# Patient Record
Sex: Male | Born: 1965 | Race: Black or African American | Hispanic: No | Smoking: Current every day smoker
Health system: Southern US, Community
[De-identification: ages and names within clinical notes are randomized; demographics above are authoritative.]

## PROBLEM LIST (undated history)

## (undated) DIAGNOSIS — E785 Hyperlipidemia, unspecified: Secondary | ICD-10-CM

## (undated) DIAGNOSIS — M109 Gout, unspecified: Secondary | ICD-10-CM

## (undated) DIAGNOSIS — I10 Essential (primary) hypertension: Secondary | ICD-10-CM

## (undated) HISTORY — PX: OTHER SURGICAL HISTORY: SHX169

---

## 2014-12-31 ENCOUNTER — Encounter (HOSPITAL_COMMUNITY): Payer: Self-pay | Admitting: Emergency Medicine

## 2014-12-31 ENCOUNTER — Emergency Department (HOSPITAL_COMMUNITY)
Admission: EM | Admit: 2014-12-31 | Discharge: 2014-12-31 | Disposition: A | Discharge status: Home / Self Care | Payer: Medicaid - Out of State | Attending: Emergency Medicine | Admitting: Emergency Medicine

## 2014-12-31 DIAGNOSIS — K088 Other specified disorders of teeth and supporting structures: Secondary | ICD-10-CM | POA: Diagnosis present

## 2014-12-31 DIAGNOSIS — I10 Essential (primary) hypertension: Secondary | ICD-10-CM | POA: Insufficient documentation

## 2014-12-31 DIAGNOSIS — K029 Dental caries, unspecified: Secondary | ICD-10-CM

## 2014-12-31 DIAGNOSIS — Z720 Tobacco use: Secondary | ICD-10-CM | POA: Insufficient documentation

## 2014-12-31 DIAGNOSIS — K0889 Other specified disorders of teeth and supporting structures: Secondary | ICD-10-CM

## 2014-12-31 HISTORY — DX: Essential (primary) hypertension: I10

## 2014-12-31 MED ORDER — AMOXICILLIN 500 MG PO CAPS: 500.0000 mg | ORAL_CAPSULE | Freq: Three times a day (TID) | ORAL | Status: DC

## 2014-12-31 MED ORDER — OXYCODONE-ACETAMINOPHEN 5-325 MG PO TABS: 2.0000 | ORAL_TABLET | ORAL | Status: DC | PRN

## 2014-12-31 MED ORDER — LISINOPRIL 10 MG PO TABS: 10.0000 mg | ORAL_TABLET | Freq: Every day | ORAL | Status: DC

## 2014-12-31 MED ORDER — OXYCODONE-ACETAMINOPHEN 5-325 MG PO TABS
2.0000 | ORAL_TABLET | Freq: Once | ORAL | Status: AC
  Administered 2014-12-31: 2 via ORAL
  Filled 2014-12-31: qty 2

## 2014-12-31 NOTE — Discharge Instructions (Signed)
Dental Caries  Dental caries (also called tooth decay) is the most common oral disease. It can occur at any age but is more common in children and young adults.   HOW DENTAL CARIES DEVELOPS   The process of decay begins when bacteria and foods (particularly sugars and starches) combine in your mouth to produce plaque. Plaque is a substance that sticks to the hard, outer surface of a tooth (enamel). The bacteria in plaque produce acids that attack enamel. These acids may also attack the root surface of a tooth (cementum) if it is exposed. Repeated attacks dissolve these surfaces and create holes in the tooth (cavities). If left untreated, the acids destroy the other layers of the tooth.   RISK FACTORS   Frequent sipping of sugary beverages.    Frequent snacking on sugary and starchy foods, especially those that easily get stuck in the teeth.    Poor oral hygiene.    Dry mouth.    Substance abuse such as methamphetamine abuse.    Broken or poor-fitting dental restorations.    Eating disorders.    Gastroesophageal reflux disease (GERD).    Certain radiation treatments to the head and neck.  SYMPTOMS  In the early stages of dental caries, symptoms are seldom present. Sometimes white, chalky areas may be seen on the enamel or other tooth layers. In later stages, symptoms may include:   Pits and holes on the enamel.   Toothache after sweet, hot, or cold foods or drinks are consumed.   Pain around the tooth.   Swelling around the tooth.  DIAGNOSIS   Most of the time, dental caries is detected during a regular dental checkup. A diagnosis is made after a thorough medical and dental history is taken and the surfaces of your teeth are checked for signs of dental caries. Sometimes special instruments, such as lasers, are used to check for dental caries. Dental X-ray exams may be taken so that areas not visible to the eye (such as between the contact areas of the teeth) can be checked for cavities.    TREATMENT   If dental caries is in its early stages, it may be reversed with a fluoride treatment or an application of a remineralizing agent at the dental office. Thorough brushing and flossing at home is needed to aid these treatments. If it is in its later stages, treatment depends on the location and extent of tooth destruction:    If a small area of the tooth has been destroyed, the destroyed area will be removed and cavities will be filled with a material such as gold, silver amalgam, or composite resin.    If a large area of the tooth has been destroyed, the destroyed area will be removed and a cap (crown) will be fitted over the remaining tooth structure.    If the center part of the tooth (pulp) is affected, a procedure called a root canal will be needed before a filling or crown can be placed.    If most of the tooth has been destroyed, the tooth may need to be pulled (extracted).  HOME CARE INSTRUCTIONS  You can prevent, stop, or reverse dental caries at home by practicing good oral hygiene. Good oral hygiene includes:   Thoroughly cleaning your teeth at least twice a day with a toothbrush and dental floss.    Using a fluoride toothpaste. A fluoride mouth rinse may also be used if recommended by your dentist or health care provider.    Restricting   the amount of sugary and starchy foods and sugary liquids you consume.    Avoiding frequent snacking on these foods and sipping of these liquids.    Keeping regular visits with a dentist for checkups and cleanings.  PREVENTION    Practice good oral hygiene.   Consider a dental sealant. A dental sealant is a coating material that is applied by your dentist to the pits and grooves of teeth. The sealant prevents food from being trapped in them. It may protect the teeth for several years.   Ask about fluoride supplements if you live in a community without fluorinated water or with water that has a low fluoride content. Use fluoride supplements  as directed by your dentist or health care provider.   Allow fluoride varnish applications to teeth if directed by your dentist or health care provider.  Document Released: 03/27/2002 Document Revised: 11/19/2013 Document Reviewed: 07/07/2012  [employer] Patient Information 2015 [employer]. This information is not intended to replace advice given to you by your health care provider. Make sure you discuss any questions you have with your health care provider.

## 2014-12-31 NOTE — ED Notes (Signed)
Pt states that his teeth are very sensitive to any food or drink. Pt states he has not been taking HTN meds due to that fact.

## 2014-12-31 NOTE — ED Provider Notes (Signed)
CSN: [id number]     Arrival date & time 12/31/14  1234 History  This chart was scribed for non-physician practitioner, Catha Gosselin, PA-C working with Azalia Bilis, MD, by Abel Presto, ED Scribe. This patient was seen in room WTR6/WTR6 and the patient's care was started at 1:05 PM.     Chief Complaint  Patient presents with  . Dental Pain    The history is provided by the patient. No language interpreter was used.   HPI Comments: Edwin Reyes is a 49 y.o. male who presents to the Emergency Department complaining of constant worsening generalized dental pain, mostly in the front and with onset 4 days ago. Pt reports similar pain in the past and h/o dental caries.  Pt states he has been unable to drink warm or cool drinks. He has been unable to take his lisinopril as he is unable to tolerate PO intake.   He reports eating and talking makes the pain worse.  Pt has tried taking ibuprofen for relief.  He denies drooling, fever, and facial swelling.   Past Medical History  Diagnosis Date  . Hypertension    History reviewed. No pertinent past surgical history. No family history on file. History  Substance Use Topics  . Smoking status: Current Every Day Smoker  . Smokeless tobacco: Not on file  . Alcohol Use: No    Review of Systems  Constitutional: Negative for fever.  HENT: Positive for dental problem. Negative for drooling and facial swelling.       Allergies  Review of patient's allergies indicates no known allergies.  Home Medications   Prior to Admission medications   Medication Sig Start Date End Date Taking? Authorizing Provider  ibuprofen (ADVIL,MOTRIN) 200 MG tablet Take 800 mg by mouth every 6 (six) hours as needed for headache or moderate pain.   Yes Historical Provider, MD  amoxicillin (AMOXIL) 500 MG capsule Take 1 capsule (500 mg total) by mouth 3 (three) times daily. 12/31/14   Rakeya Glab Patel-Mills, PA-C  lisinopril (ZESTRIL) 10 MG tablet Take 1 tablet (10 mg  total) by mouth daily. 12/31/14   Paolina Karwowski Patel-Mills, PA-C  oxyCODONE-acetaminophen (PERCOCET/ROXICET) 5-325 MG per tablet Take 2 tablets by mouth every 4 (four) hours as needed for severe pain. 12/31/14   Darryn Kydd Patel-Mills, PA-C   BP 152/94 mmHg  Pulse 66  Temp(Src) 98.1 F (36.7 C) (Oral)  Resp 18  SpO2 99% Physical Exam  Constitutional: He is oriented to person, place, and time. He appears well-developed and well-nourished.  HENT:  Head: Normocephalic.  Mouth/Throat: Uvula is midline, oropharynx is clear and moist and mucous membranes are normal. No trismus in the jaw. Abnormal dentition. Dental caries present. No dental abscesses or uvula swelling. No oropharyngeal exudate or tonsillar abscesses.     No drooling. No palpable facial abscess or dental abscess. No facial swelling. No trismus. No submandibular tenderness.  Eyes: Conjunctivae are normal.  Neck: Normal range of motion. Neck supple.  Pulmonary/Chest: Effort normal.  Musculoskeletal: Normal range of motion.  Neurological: He is alert and oriented to person, place, and time.  Skin: Skin is warm and dry.  Psychiatric: He has a normal mood and affect. His behavior is normal.  Nursing note and vitals reviewed.   ED Course  Procedures (including critical care time) DIAGNOSTIC STUDIES: Oxygen Saturation is 98% on room air, normal by my interpretation.    COORDINATION OF CARE: 1:12 PM Discussed treatment plan with patient at beside, the patient agrees with the plan and has  no further questions at this time.   Labs Review Labs Reviewed - No data to display  Imaging Review No results found.   EKG Interpretation None      MDM   Final diagnoses:  Pain, dental  Dental caries  Patient presents for dental pain. He has no signs of Ludwigs angina.  He is afebrile, no submandibular edema or tenderness, no facial swelling, no drooling, no trismus. I have prescribed the patient amoxicillin, Percocet and lisinopril. Patient  states he is from [place] and does not have any more of his lisinopril prescription remaining and is asking for a refill. His blood pressure is 152/94 and he is afebrile. Medications  oxyCODONE-acetaminophen (PERCOCET/ROXICET) 5-325 MG per tablet 2 tablet (2 tablets Oral Given 12/31/14 1323)   I personally performed the services described in this documentation, which was scribed in my presence. The recorded information has been reviewed and is accurate.     Catha Gosselin, PA-C 12/31/14 1651  Azalia Bilis, MD 01/03/15 [phone]

## 2015-09-02 ENCOUNTER — Encounter: Payer: Self-pay | Admitting: Family Medicine

## 2015-09-02 ENCOUNTER — Ambulatory Visit (INDEPENDENT_AMBULATORY_CARE_PROVIDER_SITE_OTHER): Payer: Medicaid - Out of State | Admitting: Family Medicine

## 2015-09-02 ENCOUNTER — Other Ambulatory Visit: Payer: Self-pay | Admitting: Family Medicine

## 2015-09-02 VITALS — BP 150/82 | HR 75 | Temp 98.2°F | Resp 18 | Ht 70.0 in | Wt 259.0 lb

## 2015-09-02 DIAGNOSIS — E669 Obesity, unspecified: Secondary | ICD-10-CM | POA: Diagnosis not present

## 2015-09-02 DIAGNOSIS — K0889 Other specified disorders of teeth and supporting structures: Secondary | ICD-10-CM | POA: Insufficient documentation

## 2015-09-02 DIAGNOSIS — I10 Essential (primary) hypertension: Secondary | ICD-10-CM | POA: Insufficient documentation

## 2015-09-02 DIAGNOSIS — K0510 Chronic gingivitis, plaque induced: Secondary | ICD-10-CM | POA: Diagnosis not present

## 2015-09-02 LAB — CBC WITH DIFFERENTIAL/PLATELET
BASOS ABS: 0.1 10*3/uL (ref 0.0–0.1)
Basophils Relative: 1 % (ref 0–1)
EOS ABS: 0.1 10*3/uL (ref 0.0–0.7)
Eosinophils Relative: 2 % (ref 0–5)
HCT: 42.8 % (ref 39.0–52.0)
Hemoglobin: 14.4 g/dL (ref 13.0–17.0)
LYMPHS ABS: 2 10*3/uL (ref 0.7–4.0)
Lymphocytes Relative: 30 % (ref 12–46)
MCH: 29.6 pg (ref 26.0–34.0)
MCHC: 33.6 g/dL (ref 30.0–36.0)
MCV: 88.1 fL (ref 78.0–100.0)
MPV: 9.7 fL (ref 8.6–12.4)
Monocytes Absolute: 0.8 10*3/uL (ref 0.1–1.0)
Monocytes Relative: 12 % (ref 3–12)
NEUTROS ABS: 3.6 10*3/uL (ref 1.7–7.7)
NEUTROS PCT: 55 % (ref 43–77)
PLATELETS: 310 10*3/uL (ref 150–400)
RBC: 4.86 MIL/uL (ref 4.22–5.81)
RDW: 13.7 % (ref 11.5–15.5)
WBC: 6.6 10*3/uL (ref 4.0–10.5)

## 2015-09-02 LAB — POCT URINALYSIS DIP (DEVICE)
BILIRUBIN URINE: NEGATIVE
Glucose, UA: NEGATIVE mg/dL
Hgb urine dipstick: NEGATIVE
KETONES UR: NEGATIVE mg/dL
LEUKOCYTES UA: NEGATIVE
NITRITE: NEGATIVE
Protein, ur: NEGATIVE mg/dL
Specific Gravity, Urine: 1.025 (ref 1.005–1.030)
Urobilinogen, UA: 0.2 mg/dL (ref 0.0–1.0)
pH: 6.5 (ref 5.0–8.0)

## 2015-09-02 LAB — COMPLETE METABOLIC PANEL WITH GFR
ALT: 60 U/L — AB (ref 9–46)
AST: 62 U/L — ABNORMAL HIGH (ref 10–40)
Albumin: 4.2 g/dL (ref 3.6–5.1)
Alkaline Phosphatase: 65 U/L (ref 40–115)
BUN: 14 mg/dL (ref 7–25)
CALCIUM: 9.3 mg/dL (ref 8.6–10.3)
CHLORIDE: 102 mmol/L (ref 98–110)
CO2: 29 mmol/L (ref 20–31)
Creat: 1.32 mg/dL (ref 0.60–1.35)
GFR, EST NON AFRICAN AMERICAN: 64 mL/min (ref >=60)
GFR, Est African American: 74 mL/min (ref >=60)
Glucose, Bld: 75 mg/dL (ref 65–99)
POTASSIUM: 4 mmol/L (ref 3.5–5.3)
Sodium: 137 mmol/L (ref 135–146)
Total Bilirubin: 0.5 mg/dL (ref 0.2–1.2)
Total Protein: 7.1 g/dL (ref 6.1–8.1)

## 2015-09-02 LAB — HEMOGLOBIN A1C
Hgb A1c MFr Bld: 5.7 % — ABNORMAL HIGH (ref <5.7)
Mean Plasma Glucose: 117 mg/dL — ABNORMAL HIGH (ref <117)

## 2015-09-02 MED ORDER — KETOROLAC TROMETHAMINE 60 MG/2ML IM SOLN
30.0000 mg | Freq: Once | INTRAMUSCULAR | Status: AC
  Administered 2015-09-02: 30 mg via INTRAMUSCULAR

## 2015-09-02 MED ORDER — AMOXICILLIN 500 MG PO CAPS: 500.0000 mg | ORAL_CAPSULE | Freq: Two times a day (BID) | ORAL | Status: DC

## 2015-09-02 MED ORDER — AMLODIPINE BESYLATE 5 MG PO TABS: 5.0000 mg | ORAL_TABLET | Freq: Every day | ORAL | Status: DC

## 2015-09-02 NOTE — Patient Instructions (Addendum)
Sent a referral to dental clinic .   Will start a trial of Amlodipine 5 mg for hypertension and follow up in 1 month.   DASH Eating Plan DASH stands for "Dietary Approaches to Stop Hypertension." The DASH eating plan is a healthy eating plan that has been shown to reduce high blood pressure (hypertension). Additional health benefits may include reducing the risk of type 2 diabetes mellitus, heart disease, and stroke. The DASH eating plan may also help with weight loss. WHAT DO I NEED TO KNOW ABOUT THE DASH EATING PLAN? For the DASH eating plan, you will follow these general guidelines:  Choose foods with a percent daily value for sodium of less than 5% (as listed on the food label).  Use salt-free seasonings or herbs instead of table salt or sea salt.  Check with your health care provider or pharmacist before using salt substitutes.  Eat lower-sodium products, often labeled as "lower sodium" or "no salt added."  Eat fresh foods.  Eat more vegetables, fruits, and low-fat dairy products.  Choose whole grains. Look for the word "whole" as the first word in the ingredient list.  Choose fish and skinless chicken or [place] more often than red meat. Limit fish, poultry, and meat to 6 oz (170 g) each day.  Limit sweets, desserts, sugars, and sugary drinks.  Choose heart-healthy fats.  Limit cheese to 1 oz (28 g) per day.  Eat more home-cooked food and less restaurant, buffet, and fast food.  Limit fried foods.  Cook foods using methods other than frying.  Limit canned vegetables. If you do use them, rinse them well to decrease the sodium.  When eating at a restaurant, ask that your food be prepared with less salt, or no salt if possible. WHAT FOODS CAN I EAT? Seek help from a dietitian for individual calorie needs. Grains Whole grain or whole wheat bread. Brown rice. Whole grain or whole wheat pasta. Quinoa, bulgur, and whole grain cereals. Low-sodium cereals. Corn or whole wheat  flour tortillas. Whole grain cornbread. Whole grain crackers. Low-sodium crackers. Vegetables Fresh or frozen vegetables (raw, steamed, roasted, or grilled). Low-sodium or reduced-sodium tomato and vegetable juices. Low-sodium or reduced-sodium tomato sauce and paste. Low-sodium or reduced-sodium canned vegetables.  Fruits All fresh, canned (in natural juice), or frozen fruits. Meat and Other Protein Products Ground beef (85% or leaner), grass-fed beef, or beef trimmed of fat. Skinless chicken or [place]. Ground chicken or [place]. Pork trimmed of fat. All fish and seafood. Eggs. Dried beans, peas, or lentils. Unsalted nuts and seeds. Unsalted canned beans. Dairy Low-fat dairy products, such as skim or 1% milk, 2% or reduced-fat cheeses, low-fat ricotta or cottage cheese, or plain low-fat yogurt. Low-sodium or reduced-sodium cheeses. Fats and Oils Tub margarines without trans fats. Light or reduced-fat mayonnaise and salad dressings (reduced sodium). Avocado. Safflower, olive, or canola oils. Natural peanut or almond butter. Other Unsalted popcorn and pretzels. The items listed above may not be a complete list of recommended foods or beverages. Contact your dietitian for more options. WHAT FOODS ARE NOT RECOMMENDED? Grains White bread. White pasta. White rice. Refined cornbread. Bagels and croissants. Crackers that contain trans fat. Vegetables Creamed or fried vegetables. Vegetables in a cheese sauce. Regular canned vegetables. Regular canned tomato sauce and paste. Regular tomato and vegetable juices. Fruits Dried fruits. Canned fruit in light or heavy syrup. Fruit juice. Meat and Other Protein Products Fatty cuts of meat. Ribs, chicken wings, bacon, sausage, bologna, salami, chitterlings, fatback, hot dogs, bratwurst,  and packaged luncheon meats. Salted nuts and seeds. Canned beans with salt. Dairy Whole or 2% milk, cream, half-and-half, and cream cheese. Whole-fat or sweetened yogurt.  Full-fat cheeses or blue cheese. Nondairy creamers and whipped toppings. Processed cheese, cheese spreads, or cheese curds. Condiments Onion and garlic salt, seasoned salt, table salt, and sea salt. Canned and packaged gravies. Worcestershire sauce. Tartar sauce. Barbecue sauce. Teriyaki sauce. Soy sauce, including reduced sodium. Steak sauce. Fish sauce. Oyster sauce. Cocktail sauce. Horseradish. Ketchup and mustard. Meat flavorings and tenderizers. Bouillon cubes. Hot sauce. Tabasco sauce. Marinades. Taco seasonings. Relishes. Fats and Oils Butter, stick margarine, lard, shortening, ghee, and bacon fat. Coconut, palm kernel, or palm oils. Regular salad dressings. Other Pickles and olives. Salted popcorn and pretzels. The items listed above may not be a complete list of foods and beverages to avoid. Contact your dietitian for more information. WHERE CAN I FIND MORE INFORMATION? National Heart, Lung, and Blood Institute: [employer]   This information is not intended to replace advice given to you by your health care provider. Make sure you discuss any questions you have with your health care provider.   Document Released: 06/24/2011 Document Revised: 07/26/2014 Document Reviewed: 05/09/2013 Elsevier Interactive Patient Education 2016 [employer]. Hypertension Hypertension, commonly called high blood pressure, is when the force of blood pumping through your arteries is too strong. Your arteries are the blood vessels that carry blood from your heart throughout your body. A blood pressure reading consists of a higher number over a lower number, such as 110/72. The higher number (systolic) is the pressure inside your arteries when your heart pumps. The lower number (diastolic) is the pressure inside your arteries when your heart relaxes. Ideally you want your blood pressure below 120/80. Hypertension forces your heart to work harder to pump blood. Your arteries  may become narrow or stiff. Having untreated or uncontrolled hypertension can cause heart attack, stroke, kidney disease, and other problems. RISK FACTORS Some risk factors for high blood pressure are controllable. Others are not.  Risk factors you cannot control include:   Race. You may be at higher risk if you are African American.  Age. Risk increases with age.  Gender. Men are at higher risk than women before age 27 years. After age 42, women are at higher risk than men. Risk factors you can control include:  Not getting enough exercise or physical activity.  Being overweight.  Getting too much fat, sugar, calories, or salt in your diet.  Drinking too much alcohol. SIGNS AND SYMPTOMS Hypertension does not usually cause signs or symptoms. Extremely high blood pressure (hypertensive crisis) may cause headache, anxiety, shortness of breath, and nosebleed. DIAGNOSIS To check if you have hypertension, your health care provider will measure your blood pressure while you are seated, with your arm held at the level of your heart. It should be measured at least twice using the same arm. Certain conditions can cause a difference in blood pressure between your right and left arms. A blood pressure reading that is higher than normal on one occasion does not mean that you need treatment. If it is not clear whether you have high blood pressure, you may be asked to return on a different day to have your blood pressure checked again. Or, you may be asked to monitor your blood pressure at home for 1 or more weeks. TREATMENT Treating high blood pressure includes making lifestyle changes and possibly taking medicine. Living a healthy lifestyle can help lower high blood pressure. You  may need to change some of your habits. Lifestyle changes may include:  Following the DASH diet. This diet is high in fruits, vegetables, and whole grains. It is low in salt, red meat, and added sugars.  Keep your sodium  intake below 2,300 mg per day.  Getting at least 30-45 minutes of aerobic exercise at least 4 times per week.  Losing weight if necessary.  Not smoking.  Limiting alcoholic beverages.  Learning ways to reduce stress. Your health care provider may prescribe medicine if lifestyle changes are not enough to get your blood pressure under control, and if one of the following is true:  You are 45-48 years of age and your systolic blood pressure is above 140.  You are 49 years of age or older, and your systolic blood pressure is above 150.  Your diastolic blood pressure is above 90.  You have diabetes, and your systolic blood pressure is over 140 or your diastolic blood pressure is over 90.  You have kidney disease and your blood pressure is above 140/90.  You have heart disease and your blood pressure is above 140/90. Your personal target blood pressure may vary depending on your medical conditions, your age, and other factors. HOME CARE INSTRUCTIONS  Have your blood pressure rechecked as directed by your health care provider.   Take medicines only as directed by your health care provider. Follow the directions carefully. Blood pressure medicines must be taken as prescribed. The medicine does not work as well when you skip doses. Skipping doses also puts you at risk for problems.  Do not smoke.   Monitor your blood pressure at home as directed by your health care provider. SEEK MEDICAL CARE IF:   You think you are having a reaction to medicines taken.  You have recurrent headaches or feel dizzy.  You have swelling in your ankles.  You have trouble with your vision. SEEK IMMEDIATE MEDICAL CARE IF:  You develop a severe headache or confusion.  You have unusual weakness, numbness, or feel faint.  You have severe chest or abdominal pain.  You vomit repeatedly.  You have trouble breathing. MAKE SURE YOU:   Understand these instructions.  Will watch your  condition.  Will get help right away if you are not doing well or get worse.   This information is not intended to replace advice given to you by your health care provider. Make sure you discuss any questions you have with your health care provider.   Document Released: 07/05/2005 Document Revised: 11/19/2014 Document Reviewed: 04/27/2013 Elsevier Interactive Patient Education 2016 Elsevier Inc. Managing Your High Blood Pressure Blood pressure is a measurement of how forceful your blood is pressing against the walls of the arteries. Arteries are muscular tubes within the circulatory system. Blood pressure does not stay the same. Blood pressure rises when you are active, excited, or nervous; and it lowers during sleep and relaxation. If the numbers measuring your blood pressure stay above normal most of the time, you are at risk for health problems. High blood pressure (hypertension) is a long-term (chronic) condition in which blood pressure is elevated. A blood pressure reading is recorded as two numbers, such as 120 over 80 (or 120/80). The first, higher number is called the systolic pressure. It is a measure of the pressure in your arteries as the heart beats. The second, lower number is called the diastolic pressure. It is a measure of the pressure in your arteries as the heart relaxes between beats.  Keeping your blood pressure in a normal range is important to your overall health and prevention of health problems, such as heart disease and stroke. When your blood pressure is uncontrolled, your heart has to work harder than normal. High blood pressure is a very common condition in adults because blood pressure tends to rise with age. Men and women are equally likely to have hypertension but at different times in life. Before age 78, men are more likely to have hypertension. After 48 years of age, women are more likely to have it. Hypertension is especially common in African Americans. This condition  often has no signs or symptoms. The cause of the condition is usually not known. Your caregiver can help you come up with a plan to keep your blood pressure in a normal, healthy range. BLOOD PRESSURE STAGES Blood pressure is classified into four stages: normal, prehypertension, stage 1, and stage 2. Your blood pressure reading will be used to determine what type of treatment, if any, is necessary. Appropriate treatment options are tied to these four stages:  Normal  Systolic pressure (mm Hg): below 120.  Diastolic pressure (mm Hg): below 80. Prehypertension  Systolic pressure (mm Hg): 120 to 139.  Diastolic pressure (mm Hg): 80 to 89. Stage1  Systolic pressure (mm Hg): 140 to 159.  Diastolic pressure (mm Hg): 90 to 99. Stage2  Systolic pressure (mm Hg): 160 or above.  Diastolic pressure (mm Hg): 100 or above. RISKS RELATED TO HIGH BLOOD PRESSURE Managing your blood pressure is an important responsibility. Uncontrolled high blood pressure can lead to:  A heart attack.  A stroke.  A weakened blood vessel (aneurysm).  Heart failure.  Kidney damage.  Eye damage.  Metabolic syndrome.  Memory and concentration problems. HOW TO MANAGE YOUR BLOOD PRESSURE Blood pressure can be managed effectively with lifestyle changes and medicines (if needed). Your caregiver will help you come up with a plan to bring your blood pressure within a normal range. Your plan should include the following: Education  Read all information provided by your caregivers about how to control blood pressure.  Educate yourself on the latest guidelines and treatment recommendations. New research is always being done to further define the risks and treatments for high blood pressure. Lifestylechanges  Control your weight.  Avoid smoking.  Stay physically active.  Reduce the amount of salt in your diet.  Reduce stress.  Control any chronic conditions, such as high cholesterol or  diabetes.  Reduce your alcohol intake. Medicines  Several medicines (antihypertensive medicines) are available, if needed, to bring blood pressure within a normal range. Communication  Review all the medicines you take with your caregiver because there may be side effects or interactions.  Talk with your caregiver about your diet, exercise habits, and other lifestyle factors that may be contributing to high blood pressure.  See your caregiver regularly. Your caregiver can help you create and adjust your plan for managing high blood pressure. RECOMMENDATIONS FOR TREATMENT AND FOLLOW-UP  The following recommendations are based on current guidelines for managing high blood pressure in nonpregnant adults. Use these recommendations to identify the proper follow-up period or treatment option based on your blood pressure reading. You can discuss these options with your caregiver.  Systolic pressure of 120 to 139 or diastolic pressure of 80 to 89: Follow up with your caregiver as directed.  Systolic pressure of 140 to 160 or diastolic pressure of 90 to 100: Follow up with your caregiver within 2 months.  Systolic pressure  above 160 or diastolic pressure above 100: Follow up with your caregiver within 1 month.  Systolic pressure above 180 or diastolic pressure above 110: Consider antihypertensive therapy; follow up with your caregiver within 1 week.  Systolic pressure above 200 or diastolic pressure above 120: Begin antihypertensive therapy; follow up with your caregiver within 1 week.   This information is not intended to replace advice given to you by your health care provider. Make sure you discuss any questions you have with your health care provider.   Document Released: 03/29/2012 Document Reviewed: 03/29/2012 Elsevier Interactive Patient Education 2016 Elsevier Inc. Dental Pain Dental pain may be caused by many things, including:  Tooth decay (cavities or caries). Cavities expose the  nerve of your tooth to air and hot or cold temperatures. This can cause pain or discomfort.  Abscess or infection. A dental abscess is a collection of infected pus from a bacterial infection in the inner part of the tooth (pulp). It usually occurs at the end of the tooth's root.  Injury.  An unknown reason (idiopathic). Your pain may be mild or severe. It may only occur when:  You are chewing.  You are exposed to hot or cold temperature.  You are eating or drinking sugary foods or beverages, such as soda or candy. Your pain may also be constant. HOME CARE INSTRUCTIONS Watch your dental pain for any changes. The following actions may help to lessen any discomfort that you are feeling:  Take medicines only as directed by your dentist.  If you were prescribed an antibiotic medicine, finish all of it even if you start to feel better.  Keep all follow-up visits as directed by your dentist. This is important.  Do not apply heat to the outside of your face.  Rinse your mouth or gargle with salt water if directed by your dentist. This helps with pain and swelling.  You can make salt water by adding  tsp of salt to 1 cup of warm water.  Apply ice to the painful area of your face:  Put ice in a plastic bag.  Place a towel between your skin and the bag.  Leave the ice on for 20 minutes, 2-3 times per day.  Avoid foods or drinks that cause you pain, such as:  Very hot or very cold foods or drinks.  Sweet or sugary foods or drinks. SEEK MEDICAL CARE IF:  Your pain is not controlled with medicines.  Your symptoms are worse.  You have new symptoms. SEEK IMMEDIATE MEDICAL CARE IF:  You are unable to open your mouth.  You are having trouble breathing or swallowing.  You have a fever.  Your face, neck, or jaw is swollen.   This information is not intended to replace advice given to you by your health care provider. Make sure you discuss any questions you have with your  health care provider.   Document Released: 07/05/2005 Document Revised: 11/19/2014 Document Reviewed: 07/01/2014 Elsevier Interactive Patient Education [employer]

## 2015-09-02 NOTE — Progress Notes (Signed)
Subjective:    Patient ID: Edwin Reyes, male    DOB: 1966-06-11, 50 y.o.   MRN: [id number]  HPI Mr. Edwin Reyes, a 50 year old male with a history of hypertension and dental pain presents to establish care. He states that he recently relocated from [place], [place] and was a patient of Dr. Larrie Kass. He states that he was previously on Lisinopril for hypertension, but stopped taking the medications after reading the potential effects.  Patient denies chest pain, dyspnea, fatigue, lower extremity edema, orthopnea, palpitations, syncope and tachypnea.  Cardiovascular risk factors include: male gender, obesity (BMI >= 30 kg/m2) and sedentary lifestyle.   Patient is also complaining of dental pain for greater than 1 month. He says that pain is primarily on left side with increased pain at night. Current pain intensity is 10/10 described as constant and throbbing. He states that he last had Ibuprofen 2 days ago with minimal relief. He maintains that he does not have a dentist due to lack of payer source. He reports facial swelling and periodic ear discomfort primarily to left side. Past Medical History  Diagnosis Date  . Hypertension    Past Surgical History  Procedure Laterality Date  . Sutures      staples in groin    Immunization History  Administered Date(s) Administered  . DTaP 09/01/2009  No Known Allergies   Review of Systems  Constitutional: Negative.   HENT: Positive for dental problem.   Eyes: Negative.  Negative for photophobia and visual disturbance.  Respiratory: Negative.   Cardiovascular: Negative.   Gastrointestinal: Negative.   Endocrine: Negative for polydipsia, polyphagia and polyuria.  Genitourinary: Negative.   Musculoskeletal: Negative.   Skin: Negative.   Allergic/Immunologic: Negative.   Neurological: Negative.   Hematological: Negative.   Psychiatric/Behavioral: Negative.        Objective:   Physical Exam  Constitutional: He is oriented to person,  place, and time. He appears well-developed and well-nourished.  HENT:  Head: Normocephalic and atraumatic.  Right Ear: External ear normal.  Left Ear: External ear normal.  Nose: Nose normal.  Mouth/Throat: Oropharynx is clear and moist. Abnormal dentition (missing teeth, broken crown, erythema along gumline of lefr lower mandible). Dental caries present.  Eyes: Conjunctivae and EOM are normal. Pupils are equal, round, and reactive to light.  Neck: Normal range of motion. Neck supple.  Cardiovascular: Normal rate, regular rhythm, normal heart sounds and intact distal pulses.   Pulmonary/Chest: Effort normal and breath sounds normal.  Abdominal: Soft. Bowel sounds are normal.  Musculoskeletal: Normal range of motion.  Neurological: He is alert and oriented to person, place, and time. He has normal reflexes.  Skin: Skin is warm and dry.  Psychiatric: He has a normal mood and affect. His behavior is normal. Judgment and thought content normal.     BP 150/82 mmHg  Pulse 75  Temp(Src) 98.2 F (36.8 C) (Oral)  Resp 18  Ht  (1.778 m)  Wt 259 lb (117.482 kg)  BMI 37.16 kg/m2 Assessment & Plan:  1. Essential hypertension Blood pressure is not at goal on current medication regimen. Will start a trial of Amlodipine 5 mg daily. The patient is asked to make an attempt to improve diet and exercise patterns to aid in medical management of this problem. - amLODipine (NORVASC) 5 MG tablet; Take 1 tablet (5 mg total) by mouth daily.  Dispense: 30 tablet; Refill: 0 - POCT urinalysis dipstick - EKG 12-Lead - COMPLETE METABOLIC PANEL WITH GFR  2. Obesity Recommend a lowfat, low carbohydrate diet divided over 5-6 small meals, increase water intake to 6-8 glasses, and 150 minutes per week of cardiovascular exercise.   - CBC with Differential - TSH - Hemoglobin A1c  3. Pain, dental - ketorolac (TORADOL) injection 30 mg; Inject 1 mL (30 mg total) into the muscle once. - amoxicillin (AMOXIL)  500 MG capsule; Take 1 capsule (500 mg total) by mouth 2 (two) times daily.  Dispense: 20 capsule; Refill: 0 - Ambulatory referral to Dentistry  4. Gum inflammation - amoxicillin (AMOXIL) 500 MG capsule; Take 1 capsule (500 mg total) by mouth 2 (two) times daily.  Dispense: 20 capsule; Refill: 0 - Ambulatory referral to Dentistry    Routine Health Maintenance:  Recommend follow up for digital rectal examination Will follow up in 1 month for hypertension Recommend pneumonia vaccination Patient refused influenza vaccination at this time   The patient was given clear instructions to go to ER or return to medical center if symptoms do not improve, worsen or new problems develop. The patient verbalized understanding. Will notify patient with laboratory results.  Massie Maroon, FNP

## 2015-09-03 LAB — TSH: TSH: 2.97 m[IU]/L (ref 0.40–4.50)

## 2015-09-03 LAB — HEPATITIS PANEL, ACUTE
HCV Ab: NEGATIVE
HEP B S AG: NEGATIVE
Hep A IgM: NONREACTIVE
Hep B C IgM: NONREACTIVE

## 2015-09-03 MED FILL — AMOXICILLIN 500 MG CAPSULE: 500 | 10 days supply | Qty: 20 | Fill #0

## 2015-09-03 MED FILL — AMLODIPINE BESYLATE 5 MG TA: 5 | 30 days supply | Qty: 30 | Fill #0

## 2015-10-07 ENCOUNTER — Ambulatory Visit: Payer: 59 | Admitting: Family Medicine

## 2016-04-01 ENCOUNTER — Emergency Department (HOSPITAL_COMMUNITY): Payer: BLUE CROSS/BLUE SHIELD

## 2016-04-01 ENCOUNTER — Encounter (HOSPITAL_COMMUNITY): Payer: Self-pay

## 2016-04-01 ENCOUNTER — Emergency Department (HOSPITAL_COMMUNITY)
Admission: EM | Admit: 2016-04-01 | Discharge: 2016-04-01 | Disposition: A | Discharge status: Home / Self Care | Payer: BLUE CROSS/BLUE SHIELD | Attending: Emergency Medicine | Admitting: Emergency Medicine

## 2016-04-01 DIAGNOSIS — J069 Acute upper respiratory infection, unspecified: Secondary | ICD-10-CM | POA: Insufficient documentation

## 2016-04-01 DIAGNOSIS — F17210 Nicotine dependence, cigarettes, uncomplicated: Secondary | ICD-10-CM | POA: Insufficient documentation

## 2016-04-01 DIAGNOSIS — R05 Cough: Secondary | ICD-10-CM | POA: Diagnosis present

## 2016-04-01 DIAGNOSIS — Z79899 Other long term (current) drug therapy: Secondary | ICD-10-CM | POA: Insufficient documentation

## 2016-04-01 MED ORDER — ALBUTEROL SULFATE HFA 108 (90 BASE) MCG/ACT IN AERS
2.0000 | INHALATION_SPRAY | Freq: Once | RESPIRATORY_TRACT | Status: AC
  Administered 2016-04-01: 2 via RESPIRATORY_TRACT
  Filled 2016-04-01: qty 6.7

## 2016-04-01 MED ORDER — NAPROXEN 500 MG PO TABS
500.0000 mg | ORAL_TABLET | Freq: Once | ORAL | Status: AC
  Administered 2016-04-01: 500 mg via ORAL
  Filled 2016-04-01: qty 1

## 2016-04-01 MED ORDER — SALINE SPRAY 0.65 % NA SOLN: 1.0000 | NASAL | 0 refills | Status: DC | PRN

## 2016-04-01 MED ORDER — AEROCHAMBER PLUS FLO-VU LARGE MISC
1.0000 | Freq: Once | Status: AC
  Administered 2016-04-01: 1
  Filled 2016-04-01 (×2): qty 1

## 2016-04-01 MED ORDER — BENZONATATE 100 MG PO CAPS: 100.0000 mg | ORAL_CAPSULE | Freq: Three times a day (TID) | ORAL | 0 refills | Status: DC

## 2016-04-01 MED ORDER — PREDNISONE 20 MG PO TABS: 40.0000 mg | ORAL_TABLET | Freq: Every day | ORAL | 0 refills | Status: DC

## 2016-04-01 NOTE — Discharge Instructions (Signed)
Use an albuterol inhaler, 2 puffs every 4-6 hours, as needed for chest tightness, cough, or shortness of breath. Take prednisone as prescribed until finished. You may take Tessalon as prescribed for cough. Follow up with a primary care doctor to ensure resolution of symptoms.

## 2016-04-01 NOTE — ED Triage Notes (Signed)
Pt complains of URI sx for two days, states he's had a fever, but doesn't currently

## 2016-04-01 NOTE — ED Provider Notes (Signed)
[facility] Provider Note   CSN: [id number] Arrival date & time: 04/01/16  [id number]  By signing my name below, I, Linna DarnerRussell Turner, attest that this documentation has been prepared under the direction and in the presence TRW AutomotiveKelly Nickayla Mcinnis, PA-C. Electronically Signed: Linna Darnerussell Turner, Scribe. 04/01/2016. 1:03 AM.  History   Chief Complaint Chief Complaint  Patient presents with  . Cough  . Fever    The history is provided by the patient. No language interpreter was used.     HPI Comments: Edwin Reyes is a 50 y.o. male who presents to the Emergency Department complaining of general malaise over the last two days. Pt endorses myalgias from the waist up, cough, rhinorrhea, and congestion for the last two days. Pt reports that several hours ago at work, he experienced lightheadedness and chest tightness. He reports he began to feel febrile at work and experienced postnasal drip several hours ago that has resolved. Pt also notes his throat feels dry. No sick contacts noted. Pt has not tried any medications for his symptoms. He denies sore throat, syncope, SOB, numbness, weakness, or any other associated symptoms.  Past Medical History:  Diagnosis Date  . Hypertension     Patient Active Problem List   Diagnosis Date Noted  . Essential hypertension 09/02/2015  . Obesity 09/02/2015  . Pain, dental 09/02/2015  . Gum inflammation 09/02/2015    Past Surgical History:  Procedure Laterality Date  . sutures     staples in groin     OB History    No data available       Home Medications    Prior to Admission medications   Medication Sig Start Date End Date Taking? Authorizing Provider  amLODipine (NORVASC) 5 MG tablet Take 1 tablet (5 mg total) by mouth daily. 09/02/15   Massie MaroonLachina M Hollis, FNP  amoxicillin (AMOXIL) 500 MG capsule Take 1 capsule (500 mg total) by mouth 2 (two) times daily. 09/02/15   Massie MaroonLachina M Hollis, FNP  benzonatate (TESSALON) 100 MG capsule Take 1 capsule (100 mg  total) by mouth every 8 (eight) hours. 04/01/16   Antony MaduraKelly Emmanuella Mirante, PA-C  predniSONE (DELTASONE) 20 MG tablet Take 2 tablets (40 mg total) by mouth daily. 04/01/16   Antony MaduraKelly Liela Rylee, PA-C  sodium chloride (OCEAN) 0.65 % SOLN nasal spray Place 1 spray into both nostrils as needed for congestion. 04/01/16   Antony MaduraKelly Nykerria Macconnell, PA-C    Family History History reviewed. No pertinent family history.  Social History Social History  Substance Use Topics  . Smoking status: Current Every Day Smoker    Packs/day: 0.50    Types: Cigarettes  . Smokeless tobacco: Never Used  . Alcohol use No     Allergies   Review of patient's allergies indicates no known allergies.   Review of Systems Review of Systems A complete 10 system review of systems was obtained and all systems are negative except as noted in the HPI and PMH.    Physical Exam Updated Vital Signs BP 159/100 (BP Location: Left Arm)   Pulse 85   Temp 98.8 F (37.1 C) (Oral)   Resp 20   Ht 5\' 11"  (1.803 m)   Wt 113.4 kg   SpO2 97%   BMI 34.87 kg/m   Physical Exam  Constitutional: He is oriented to person, place, and time. He appears well-developed and well-nourished. No distress.  Nontoxic appearing and in no distress.  HENT:  Head: Normocephalic and atraumatic.  Right Ear: Tympanic membrane, external ear and ear canal  normal.  Left Ear: Tympanic membrane, external ear and ear canal normal.  Mouth/Throat: Uvula is midline and oropharynx is clear and moist.  Mild audible nasal congestion. Oropharynx clear. Patient tolerating secretions without difficulty.  Eyes: Conjunctivae and EOM are normal. No scleral icterus.  Neck: Normal range of motion.  No JVD  Cardiovascular: Normal rate, regular rhythm and intact distal pulses.   Pulmonary/Chest: Effort normal. No respiratory distress. He has no wheezes. He has no rales.  Respirations even and unlabored. Lungs clear to auscultation bilaterally.  Musculoskeletal: Normal range of motion.    Neurological: He is alert and oriented to person, place, and time.  GCS 15. Patient moving all extremities.  Skin: Skin is warm and dry. No rash noted. He is not diaphoretic. No erythema. No pallor.  Psychiatric: He has a normal mood and affect. His behavior is normal.  Nursing note and vitals reviewed.    ED Treatments / Results  Labs (all labs ordered are listed, but only abnormal results are displayed) Labs Reviewed - No data to display  EKG  EKG Interpretation None       Radiology Dg Chest 2 View  Result Date: 04/01/2016 CLINICAL DATA:  Fever, cough and upper chest pain for 48 hours EXAM: CHEST  2 VIEW COMPARISON:  None. FINDINGS: The lungs are clear. The pulmonary vasculature is normal. Heart size is normal. Hilar and mediastinal contours are unremarkable. There is no pleural effusion. IMPRESSION: No active cardiopulmonary disease. Electronically Signed   By: Ellery Plunk M.D.   On: 04/01/2016 01:18    Procedures Procedures (including critical care time)  DIAGNOSTIC STUDIES: Oxygen Saturation is 97% on RA, normal by my interpretation.    COORDINATION OF CARE: 1:10 AM Discussed treatment plan with pt at bedside and pt agreed to plan.  Medications Ordered in ED Medications  naproxen (NAPROSYN) tablet 500 mg (500 mg Oral Given 04/01/16 0126)  albuterol (PROVENTIL HFA;VENTOLIN HFA) 108 (90 Base) MCG/ACT inhaler 2 puff (2 puffs Inhalation Given 04/01/16 0127)  AEROCHAMBER PLUS FLO-VU LARGE MISC 1 each (1 each Other Given 04/01/16 0126)     Initial Impression / Assessment and Plan / ED Course  I have reviewed the triage vital signs and the nursing notes.  Pertinent labs & imaging results that were available during my care of the patient were reviewed by me and considered in my medical decision making (see chart for details).  Clinical Course    Pt CXR negative for acute infiltrate. Patients symptoms are consistent with URI, likely viral etiology. Discussed that  antibiotics are not indicated for viral infections. He reports improvement in chest tightness with albuterol inhaler used while in the department. Patient will be discharged with symptomatic treatment. He verbalizes understanding and is agreeable with plan. Pt is hemodynamically stable and in NAD prior to discharge.    Final Clinical Impressions(s) / ED Diagnoses   Final diagnoses:  URI (upper respiratory infection)    New Prescriptions Discharge Medication List as of 04/01/2016  2:19 AM    START taking these medications   Details  benzonatate (TESSALON) 100 MG capsule Take 1 capsule (100 mg total) by mouth every 8 (eight) hours., Starting Thu 04/01/2016, Print    predniSONE (DELTASONE) 20 MG tablet Take 2 tablets (40 mg total) by mouth daily., Starting Thu 04/01/2016, Print    sodium chloride (OCEAN) 0.65 % SOLN nasal spray Place 1 spray into both nostrils as needed for congestion., Starting Thu 04/01/2016, Print  I personally performed the services described in this documentation, which was scribed in my presence. The recorded information has been reviewed and is accurate.      Antony Madura, PA-C 04/01/16 [id number]    Derwood Kaplan, MD 04/01/16 [phone]

## 2016-04-01 NOTE — ED Notes (Signed)
Patient was alert, oriented and stable upon discharge. RN went over AVS and patient had no further questions.  

## 2016-05-02 ENCOUNTER — Ambulatory Visit (HOSPITAL_COMMUNITY)
Admission: EM | Admit: 2016-05-02 | Discharge: 2016-05-02 | Disposition: A | Discharge status: Home / Self Care | Payer: BLUE CROSS/BLUE SHIELD | Attending: Internal Medicine | Admitting: Internal Medicine

## 2016-05-02 ENCOUNTER — Encounter (HOSPITAL_COMMUNITY): Payer: Self-pay | Admitting: Emergency Medicine

## 2016-05-02 DIAGNOSIS — K122 Cellulitis and abscess of mouth: Secondary | ICD-10-CM | POA: Diagnosis not present

## 2016-05-02 DIAGNOSIS — R030 Elevated blood-pressure reading, without diagnosis of hypertension: Secondary | ICD-10-CM

## 2016-05-02 DIAGNOSIS — I10 Essential (primary) hypertension: Secondary | ICD-10-CM

## 2016-05-02 MED ORDER — AMOXICILLIN-POT CLAVULANATE 875-125 MG PO TABS: 1.0000 | ORAL_TABLET | Freq: Two times a day (BID) | ORAL | 0 refills | Status: AC

## 2016-05-02 MED ORDER — NAPROXEN 500 MG PO TABS: 500.0000 mg | ORAL_TABLET | Freq: Two times a day (BID) | ORAL | 0 refills | Status: DC

## 2016-05-02 MED ORDER — AMLODIPINE BESYLATE 5 MG PO TABS: 5.0000 mg | ORAL_TABLET | Freq: Every day | ORAL | 1 refills | Status: DC

## 2016-05-02 NOTE — Discharge Instructions (Addendum)
Take amoxicillin/clavulanate for gum infection.  Anticipate improvement in discomfort/swelling in the next 48 hours, followup with dentist if this does not occur or for new fever >100.5 or facial swelling.   Make appointment with a primary care provider to follow blood pressure; Dr Patsey Bertholdook's office number is given for convenience.

## 2016-05-02 NOTE — ED Provider Notes (Signed)
[facility]    CSN: [id number] Arrival date & time: 05/02/16  1207     History   Chief Complaint Chief Complaint  Patient presents with  . Dental Pain    HPI Edwin Reyes is a 50 y.o. male. He presents today with pain in the spaces behind the left and right lower molars, severe, started 2d ago.  Had a temporary dental implant placed 3d ago, after root canal for bad upper left bicuspid.  No fever, no malaise, but bilat post lower jaws are very painful.      HPI  Past Medical History:  Diagnosis Date  . Hypertension     Patient Active Problem List   Diagnosis Date Noted  . Essential hypertension 09/02/2015  . Obesity 09/02/2015  . Pain, dental 09/02/2015  . Gum inflammation 09/02/2015    Past Surgical History:  Procedure Laterality Date  . sutures     staples in groin        Home Medications    Prior to Admission medications   Medication Sig Start Date End Date Taking? Authorizing Provider  amoxicillin (AMOXIL) 500 MG capsule Take 1 capsule (500 mg total) by mouth 2 (two) times daily. 09/02/15  Yes Massie Maroon, FNP  benzonatate (TESSALON) 100 MG capsule Take 1 capsule (100 mg total) by mouth every 8 (eight) hours. 04/01/16  Yes Antony Madura, PA-C  amLODipine (NORVASC) 5 MG tablet Take 1 tablet (5 mg total) by mouth daily. 05/02/16   Eustace Moore, MD  amoxicillin-clavulanate (AUGMENTIN) 875-125 MG tablet Take 1 tablet by mouth 2 (two) times daily. 05/02/16 05/12/16  Eustace Moore, MD  naproxen (NAPROSYN) 500 MG tablet Take 1 tablet (500 mg total) by mouth 2 (two) times daily. 05/02/16   Eustace Moore, MD  predniSONE (DELTASONE) 20 MG tablet Take 2 tablets (40 mg total) by mouth daily. 04/01/16   Antony Madura, PA-C  sodium chloride (OCEAN) 0.65 % SOLN nasal spray Place 1 spray into both nostrils as needed for congestion. 04/01/16   Antony Madura, PA-C    Family History History reviewed. No pertinent family history.  Social History Social  History  Substance Use Topics  . Smoking status: Current Every Day Smoker    Packs/day: 0.50    Years: 30.00    Types: Cigarettes  . Smokeless tobacco: Never Used  . Alcohol use No     Allergies   Review of patient's allergies indicates no known allergies.   Review of Systems Review of Systems  All other systems reviewed and are negative.    Physical Exam Triage Vital Signs ED Triage Vitals  Enc Vitals Group     BP 05/02/16 1226 (!) 165/107     Pulse Rate 05/02/16 1226 66     Resp 05/02/16 1226 18     Temp 05/02/16 1226 98.6 F (37 C)     Temp Source 05/02/16 1226 Oral     SpO2 05/02/16 1226 100 %     Weight --      Height --      Pain Score 05/02/16 1228 9   Updated Vital Signs BP (!) 165/107 (BP Location: Left Arm)   Pulse 66   Temp 98.6 F (37 C) (Oral)   Resp 18   SpO2 100%  Physical Exam  Constitutional: He is oriented to person, place, and time. No distress.  Alert, nicely groomed  HENT:  Head: Atraumatic.  Mouth/Throat:    Tender swollen gingival areas behind lower molars  bilaterally, with tiny ulcerations.    Eyes:  Conjugate gaze, no eye redness/drainage  Neck: Neck supple.  Cardiovascular: Normal rate.   Pulmonary/Chest: No respiratory distress.  Abdominal: He exhibits no distension.  Musculoskeletal: Normal range of motion.  Neurological: He is alert and oriented to person, place, and time.  Skin: Skin is warm and dry.  No cyanosis  Nursing note and vitals reviewed.    UC Treatments / Results   Procedures Procedures (including critical care time)  Final Clinical Impressions(s) / UC Diagnoses   Final diagnoses:  Cellulitis of gingiva  Elevated blood pressure reading   Take amoxicillin/clavulanate for gum infection.  Anticipate improvement in discomfort/swelling in the next 48 hours, followup with dentist if this does not occur or for new fever >100.5 or facial swelling.   Make appointment with a primary care provider to follow  blood pressure; Dr Patsey Bertholdook's office number is given for convenience.  New Prescriptions New Prescriptions   AMOXICILLIN-CLAVULANATE (AUGMENTIN) 875-125 MG TABLET    Take 1 tablet by mouth 2 (two) times daily.   NAPROXEN (NAPROSYN) 500 MG TABLET    Take 1 tablet (500 mg total) by mouth 2 (two) times daily.     Eustace MooreLaura W Nashonda Limberg, MD 05/06/16 [phone]

## 2016-05-02 NOTE — ED Triage Notes (Signed)
[facility] a [facility] pain that he believed to possibly be sores in the back of his mouth. The patient also reported that he is no longer taking his HTN meds and was not sure what they were.

## 2016-08-15 DIAGNOSIS — I10 Essential (primary) hypertension: Secondary | ICD-10-CM | POA: Insufficient documentation

## 2016-08-15 DIAGNOSIS — Z79899 Other long term (current) drug therapy: Secondary | ICD-10-CM | POA: Insufficient documentation

## 2016-08-15 DIAGNOSIS — F17210 Nicotine dependence, cigarettes, uncomplicated: Secondary | ICD-10-CM | POA: Diagnosis not present

## 2016-08-15 DIAGNOSIS — M545 Low back pain: Secondary | ICD-10-CM | POA: Insufficient documentation

## 2016-08-16 ENCOUNTER — Encounter (HOSPITAL_COMMUNITY): Payer: Self-pay | Admitting: Emergency Medicine

## 2016-08-16 ENCOUNTER — Emergency Department (HOSPITAL_COMMUNITY)
Admission: EM | Admit: 2016-08-16 | Discharge: 2016-08-16 | Disposition: A | Discharge status: Home / Self Care | Payer: BLUE CROSS/BLUE SHIELD | Attending: Emergency Medicine | Admitting: Emergency Medicine

## 2016-08-16 DIAGNOSIS — M545 Low back pain, unspecified: Secondary | ICD-10-CM

## 2016-08-16 DIAGNOSIS — I10 Essential (primary) hypertension: Secondary | ICD-10-CM

## 2016-08-16 LAB — URINALYSIS, ROUTINE W REFLEX MICROSCOPIC
BILIRUBIN URINE: NEGATIVE
Glucose, UA: NEGATIVE mg/dL
HGB URINE DIPSTICK: NEGATIVE
Ketones, ur: 5 mg/dL — AB
Leukocytes, UA: NEGATIVE
Nitrite: NEGATIVE
PH: 5 (ref 5.0–8.0)
Protein, ur: NEGATIVE mg/dL
Specific Gravity, Urine: 1.031 — ABNORMAL HIGH (ref 1.005–1.030)

## 2016-08-16 LAB — CBC
HCT: 42 % (ref 39.0–52.0)
HEMOGLOBIN: 14.3 g/dL (ref 13.0–17.0)
MCH: 29.5 pg (ref 26.0–34.0)
MCHC: 34 g/dL (ref 30.0–36.0)
MCV: 86.8 fL (ref 78.0–100.0)
Platelets: 292 10*3/uL (ref 150–400)
RBC: 4.84 MIL/uL (ref 4.22–5.81)
RDW: 13.2 % (ref 11.5–15.5)
WBC: 8.4 10*3/uL (ref 4.0–10.5)

## 2016-08-16 LAB — COMPREHENSIVE METABOLIC PANEL
ALBUMIN: 3.4 g/dL — AB (ref 3.5–5.0)
ALK PHOS: 62 U/L (ref 38–126)
ALT: 33 U/L (ref 17–63)
AST: 31 U/L (ref 15–41)
Anion gap: 6 (ref 5–15)
BUN: 12 mg/dL (ref 6–20)
CO2: 26 mmol/L (ref 22–32)
CREATININE: 1.27 mg/dL — AB (ref 0.61–1.24)
Calcium: 8.8 mg/dL — ABNORMAL LOW (ref 8.9–10.3)
Chloride: 108 mmol/L (ref 101–111)
GFR calc Af Amer: >60 mL/min (ref >60)
GFR calc non Af Amer: >60 mL/min (ref >60)
GLUCOSE: 115 mg/dL — AB (ref 65–99)
Potassium: 3.5 mmol/L (ref 3.5–5.1)
SODIUM: 140 mmol/L (ref 135–145)
Total Bilirubin: 0.7 mg/dL (ref 0.3–1.2)
Total Protein: 5.7 g/dL — ABNORMAL LOW (ref 6.5–8.1)

## 2016-08-16 MED ORDER — NAPROXEN 250 MG PO TABS
500.0000 mg | ORAL_TABLET | Freq: Once | ORAL | Status: AC
  Administered 2016-08-16: 500 mg via ORAL
  Filled 2016-08-16: qty 2

## 2016-08-16 MED ORDER — NAPROXEN 500 MG PO TABS: 500.0000 mg | ORAL_TABLET | Freq: Two times a day (BID) | ORAL | 0 refills | Status: DC

## 2016-08-16 MED ORDER — CYCLOBENZAPRINE HCL 5 MG PO TABS: 5.0000 mg | ORAL_TABLET | Freq: Three times a day (TID) | ORAL | 0 refills | Status: DC | PRN

## 2016-08-16 MED ORDER — AMLODIPINE BESYLATE 5 MG PO TABS: 5.0000 mg | ORAL_TABLET | Freq: Every day | ORAL | 0 refills | Status: DC

## 2016-08-16 NOTE — ED Triage Notes (Signed)
Patient with lower back pain.  He denies any injury, denies any pain when he urinates.  Patient states that tonight he was in bed and there was a "ring" around his abdomen.  He states that the pain is now just on the left side.

## 2016-08-16 NOTE — ED Notes (Signed)
Pt also states that he does not have PCP and is unable to refill Norvasc, EDP aware and states she will refill prescription.

## 2016-08-16 NOTE — ED Provider Notes (Signed)
[facility] Provider Note   CSN: [id number] Arrival date & time: 08/15/16  2358  By signing my name below, I, Edwin Reyes, attest that this documentation has been prepared under the direction and in the presence of Ross Marcus, MD. Electronically Signed: Javier Reyes, ER Scribe. 02/28/2016. 1:44 AM.  History   Chief Complaint Chief Complaint  Patient presents with  . Back Pain   The history is provided by the patient. No language interpreter was used.    HPI Comments: Edwin Reyes is a 51 y.o. male who presents to the Emergency Department complaining of three days of worsening mid back pain. The pain does not radiate. He denies MOI. He denies weakness or numbness in his legs. He denies hematuria or other urinary sx. He has a PMHx of HTN but no other chronic medical problems. He states his pain is 6/10 at rest but 10/10 when he moves or twists. He has taken tylenol and ibuprofen without relief.    Past Medical History:  Diagnosis Date  . Hypertension     Patient Active Problem List   Diagnosis Date Noted  . Essential hypertension 09/02/2015  . Obesity 09/02/2015  . Pain, dental 09/02/2015  . Gum inflammation 09/02/2015    Past Surgical History:  Procedure Laterality Date  . sutures     staples in groin        Home Medications    Prior to Admission medications   Medication Sig Start Date End Date Taking? Authorizing Provider  amLODipine (NORVASC) 5 MG tablet Take 1 tablet (5 mg total) by mouth daily. 08/16/16   Shon Baton, MD  amoxicillin (AMOXIL) 500 MG capsule Take 1 capsule (500 mg total) by mouth 2 (two) times daily. 09/02/15   Massie Maroon, FNP  benzonatate (TESSALON) 100 MG capsule Take 1 capsule (100 mg total) by mouth every 8 (eight) hours. 04/01/16   Antony Madura, PA-C  cyclobenzaprine (FLEXERIL) 5 MG tablet Take 1 tablet (5 mg total) by mouth 3 (three) times daily as needed for muscle spasms. 08/16/16   Shon Baton, MD    naproxen (NAPROSYN) 500 MG tablet Take 1 tablet (500 mg total) by mouth 2 (two) times daily. 08/16/16   Shon Baton, MD  predniSONE (DELTASONE) 20 MG tablet Take 2 tablets (40 mg total) by mouth daily. 04/01/16   Antony Madura, PA-C  sodium chloride (OCEAN) 0.65 % SOLN nasal spray Place 1 spray into both nostrils as needed for congestion. 04/01/16   Antony Madura, PA-C    Family History No family history on file.  Social History Social History  Substance Use Topics  . Smoking status: Current Every Day Smoker    Packs/day: 0.50    Years: 30.00    Types: Cigarettes  . Smokeless tobacco: Never Used  . Alcohol use No     Allergies   Patient has no known allergies.   Review of Systems Review of Systems  Constitutional: Negative for chills and fever.  Musculoskeletal: Positive for back pain.  Neurological: Negative for weakness and numbness.  All other systems reviewed and are negative.    Physical Exam Updated Vital Signs BP (!) 153/109 (BP Location: Right Arm)   Pulse 76   Temp 98.3 F (36.8 C)   Resp 17   SpO2 100%   Physical Exam  Constitutional: He is oriented to person, place, and time. He appears well-developed and well-nourished. No distress.  Overweight  HENT:  Head: Normocephalic and atraumatic.  Cardiovascular:  Normal rate, regular rhythm and normal heart sounds.   No murmur heard. Pulmonary/Chest: Effort normal and breath sounds normal. No respiratory distress. He has no wheezes.  Abdominal: Soft. There is no tenderness.  Musculoskeletal: He exhibits no edema.  Tenderness palpation left paraspinous muscle region, no midline tenderness, step-off, deformity, no CVA tenderness  Neurological: He is alert and oriented to person, place, and time.  5 out of 5 strength bilateral lower extremities, normal reflexes bilaterally  Skin: Skin is warm and dry.  Psychiatric: He has a normal mood and affect.  Nursing note and vitals reviewed.    ED Treatments /  Results  DIAGNOSTIC STUDIES: Oxygen Saturation is 100% on RA, normal by my interpretation.    COORDINATION OF CARE: 1:29 AM Discussed treatment plan with pt at bedside and pt agreed to plan.  Labs (all labs ordered are listed, but only abnormal results are displayed) Labs Reviewed  COMPREHENSIVE METABOLIC PANEL - Abnormal; Notable for the following:       Result Value   Glucose, Bld 115 (*)    Creatinine, Ser 1.27 (*)    Calcium 8.8 (*)    Total Protein 5.7 (*)    Albumin 3.4 (*)    All other components within normal limits  URINALYSIS, ROUTINE W REFLEX MICROSCOPIC - Abnormal; Notable for the following:    Specific Gravity, Urine 1.031 (*)    Ketones, ur 5 (*)    All other components within normal limits  CBC    EKG  EKG Interpretation None       Radiology No results found.  Procedures Procedures (including critical care time)  Medications Ordered in ED Medications  naproxen (NAPROSYN) tablet 500 mg (500 mg Oral Given 08/16/16 0150)     Initial Impression / Assessment and Plan / ED Course  I have reviewed the triage vital signs and the nursing notes.  Pertinent labs & imaging results that were available during my care of the patient were reviewed by me and considered in my medical decision making (see chart for details).    Patient presents with back pain 3 days. No red flags. Does work for [employer] for a living but denies any known injury. History most suggestive of musculoskeletal etiology. Patient otherwise well-appearing. Hypertensive. Out of BP medications. Lab work sent from triage reviewed and reassuring including urinalysis. Do not feel patient needs imaging at this time as I feel will be low yield. No signs or symptoms of cauda equina. Recommend NSAIDs and muscle relaxer. Patient will be given a refill of his amlodipine.  After history, exam, and medical workup I feel the patient has been appropriately medically screened and is safe for discharge home.  Pertinent diagnoses were discussed with the patient. Patient was given return precautions.   Final Clinical Impressions(s) / ED Diagnoses   Final diagnoses:  Left-sided low back pain without sciatica, unspecified chronicity  Essential hypertension    New Prescriptions New Prescriptions   AMLODIPINE (NORVASC) 5 MG TABLET    Take 1 tablet (5 mg total) by mouth daily.   CYCLOBENZAPRINE (FLEXERIL) 5 MG TABLET    Take 1 tablet (5 mg total) by mouth 3 (three) times daily as needed for muscle spasms.   NAPROXEN (NAPROSYN) 500 MG TABLET    Take 1 tablet (500 mg total) by mouth 2 (two) times daily.   I personally performed the services described in this documentation, which was scribed in my presence. The recorded information has been reviewed and is accurate.  Shon Baton, MD 08/16/16 [phone]

## 2016-08-16 NOTE — Discharge Instructions (Signed)
You were seen today for back pain. This is likely musculoskeletal nature. He need to take naproxen twice daily and he will be provided with a muscle relaxer. Do not drive or operate heavy machinery while using a muscle relaxer.

## 2016-11-17 ENCOUNTER — Ambulatory Visit (HOSPITAL_COMMUNITY)
Admission: EM | Admit: 2016-11-17 | Discharge: 2016-11-17 | Disposition: A | Discharge status: Home / Self Care | Payer: BLUE CROSS/BLUE SHIELD | Attending: Emergency Medicine | Admitting: Emergency Medicine

## 2016-11-17 ENCOUNTER — Encounter (HOSPITAL_COMMUNITY): Payer: Self-pay | Admitting: Emergency Medicine

## 2016-11-17 DIAGNOSIS — M7662 Achilles tendinitis, left leg: Secondary | ICD-10-CM

## 2016-11-17 DIAGNOSIS — M7672 Peroneal tendinitis, left leg: Secondary | ICD-10-CM

## 2016-11-17 MED ORDER — AMLODIPINE BESYLATE 5 MG PO TABS: 5.0000 mg | ORAL_TABLET | Freq: Every day | ORAL | 0 refills | Status: DC

## 2016-11-17 NOTE — ED Notes (Signed)
On discharge, patient asked for blood pressure medicine.  Notified Hayden Rasmussen, np of patient's request.  Onalee Hua will address, notified patient.  Provided patient with ginger ale/ice chips while waiting

## 2016-11-17 NOTE — ED Provider Notes (Signed)
CSN: [id number]     Arrival date & time 11/17/16  1105 History   First MD Initiated Contact with Patient 11/17/16 1155     Chief Complaint  Patient presents with  . Ankle Pain   (Consider location/radiation/quality/duration/timing/severity/associated sxs/prior Treatment) 51 year old male complaining of left posterior heel pain as well as left lateral ankle pain just distal/inferior to the lateral malleolus. Denies any known injury. Gradual onset at work. 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 This basically means that he is continually turning and twisting his knees ankles and hips for several hours a day. He noticed that this was occurring during work. Denies any known injury. No trauma. Nothing popped or happened suddenly. No known twisting of the ankle or other forces that would have caused a more serious injury.  Several minutes after the patient had been discharged and the provider was seeing other patients he mentioned to the nurse that he would like to have her refill on his blood pressure medicine.      Past Medical History:  Diagnosis Date  . Hypertension    Past Surgical History:  Procedure Laterality Date  . sutures     staples in groin    History reviewed. No pertinent family history. Social History  Substance Use Topics  . Smoking status: Current Every Day Smoker    Packs/day: 0.50    Years: 30.00    Types: Cigarettes  . Smokeless tobacco: Never Used  . Alcohol use No    Review of Systems  Constitutional: Negative.   Respiratory: Negative.   Gastrointestinal: Negative.   Genitourinary: Negative.   Musculoskeletal:       As per HPI  Skin: Negative.   Neurological: Negative for dizziness, weakness, numbness and headaches.  All other systems reviewed and are negative.   Allergies  Patient has no known allergies.  Home Medications   Prior to Admission  medications   Medication Sig Start Date End Date Taking? Authorizing Provider  amLODipine (NORVASC) 5 MG tablet Take 1 tablet (5 mg total) by mouth daily. 08/16/16   Shon Baton, MD   Meds Ordered and Administered this Visit  Medications - No data to display  BP 133/87 (BP Location: Right Arm)   Pulse 68   Temp 98.4 F (36.9 C) (Oral)   Resp 18   SpO2 97%  No data found.   Physical Exam  Constitutional: He is oriented to person, place, and time. He appears well-developed and well-nourished. No distress.  HENT:  Head: Normocephalic and atraumatic.  Eyes: EOM are normal.  Neck: Neck supple.  Cardiovascular: Normal rate.   Pulmonary/Chest: Effort normal. No respiratory distress.  Abdominal: Soft. There is no tenderness. There is no rebound.  Musculoskeletal: He exhibits tenderness. He exhibits no edema.  Tenderness along the posterior Achilles tendon. The contour is even. Do not appreciate an indentation that may result represent a rupture. He is able to dorsiflex and plantarflex however plantar flexion produces more pain. There is also minor puffiness and tenderness just distal to the lateral malleolus. No bony tenderness. No tenderness to the foot. Distal neurovascular motor sensory is grossly intact. Pedal pulse 2+.  Neurological: He is alert and oriented to person, place, and time. No cranial nerve deficit.  Skin: Skin is warm and dry. Capillary refill takes less than 2 seconds. No rash noted.  Nursing note and  vitals reviewed.   Urgent Care Course     Procedures (including critical care time)  Labs Review Labs Reviewed - No data to display  Imaging Review No results found.   Visual Acuity Review  Right Eye Distance:   Left Eye Distance:   Bilateral Distance:    Right Eye Near:   Left Eye Near:    Bilateral Near:         MDM   1. Achilles tendinitis of left lower extremity   2. Peroneus longus tendinitis, left    Wear the wrap and the shoe over the  next 3-4 days. Keep elevated and apply ice to the areas of soreness. At work at least try to wear the code band wrapped with slight elevation of the heel. You may want to consider placing a thick pad in your boot heel at work to keep from stretching the Achilles tendon. You will also need to change the way you are stepping and walking for a few days to allow healing. If not getting better follow-up with orthopedist. Immobilization as directed. Meds ordered this encounter  Medications  . amLODipine (NORVASC) 5 MG tablet    Sig: Take 1 tablet (5 mg total) by mouth daily. Must see personal physician for additional refills    Dispense:  30 tablet    Refill:  0    Order Specific Question:   Supervising Provider    Answer:   Domenick Gong [[id number]]      Hayden Rasmussen, NP 11/17/16 1228    Hayden Rasmussen, NP 11/17/16 1255

## 2016-11-17 NOTE — Discharge Instructions (Signed)
Wear the wrap and the shoe over the next 3-4 days. Keep elevated and apply ice to the areas of soreness. At work at least try to wear the code band wrapped with slight elevation of the heel. You may want to consider placing a thick pad in your boot heel at work to keep from stretching the Achilles tendon. You will also need to change the way you are stepping and walking for a few days to allow healing. If not getting better follow-up with orthopedist.

## 2016-11-17 NOTE — ED Triage Notes (Signed)
The patient presented to the [facility] with a complaint of left ankle pain x 3 days. The patient denied any known injury.

## 2017-04-18 ENCOUNTER — Emergency Department (HOSPITAL_COMMUNITY): Payer: BLUE CROSS/BLUE SHIELD

## 2017-04-18 ENCOUNTER — Encounter (HOSPITAL_COMMUNITY): Payer: Self-pay | Admitting: Emergency Medicine

## 2017-04-18 ENCOUNTER — Emergency Department (HOSPITAL_COMMUNITY)
Admission: EM | Admit: 2017-04-18 | Discharge: 2017-04-18 | Disposition: A | Discharge status: Home / Self Care | Payer: BLUE CROSS/BLUE SHIELD | Attending: Emergency Medicine | Admitting: Emergency Medicine

## 2017-04-18 DIAGNOSIS — F17210 Nicotine dependence, cigarettes, uncomplicated: Secondary | ICD-10-CM | POA: Insufficient documentation

## 2017-04-18 DIAGNOSIS — R1011 Right upper quadrant pain: Secondary | ICD-10-CM | POA: Diagnosis not present

## 2017-04-18 DIAGNOSIS — R109 Unspecified abdominal pain: Secondary | ICD-10-CM | POA: Diagnosis present

## 2017-04-18 DIAGNOSIS — Z79899 Other long term (current) drug therapy: Secondary | ICD-10-CM | POA: Diagnosis not present

## 2017-04-18 DIAGNOSIS — I10 Essential (primary) hypertension: Secondary | ICD-10-CM | POA: Diagnosis not present

## 2017-04-18 DIAGNOSIS — R197 Diarrhea, unspecified: Secondary | ICD-10-CM | POA: Diagnosis not present

## 2017-04-18 DIAGNOSIS — R112 Nausea with vomiting, unspecified: Secondary | ICD-10-CM

## 2017-04-18 LAB — COMPREHENSIVE METABOLIC PANEL
ALBUMIN: 3.2 g/dL — AB (ref 3.5–5.0)
ALK PHOS: 64 U/L (ref 38–126)
ALT: 20 U/L (ref 17–63)
ANION GAP: 6 (ref 5–15)
AST: 26 U/L (ref 15–41)
BUN: 10 mg/dL (ref 6–20)
CALCIUM: 8.7 mg/dL — AB (ref 8.9–10.3)
CO2: 27 mmol/L (ref 22–32)
CREATININE: 1.25 mg/dL — AB (ref 0.61–1.24)
Chloride: 107 mmol/L (ref 101–111)
GFR calc Af Amer: >60 mL/min (ref >60)
GFR calc non Af Amer: >60 mL/min (ref >60)
GLUCOSE: 133 mg/dL — AB (ref 65–99)
Potassium: 3.6 mmol/L (ref 3.5–5.1)
SODIUM: 140 mmol/L (ref 135–145)
TOTAL PROTEIN: 5.4 g/dL — AB (ref 6.5–8.1)
Total Bilirubin: 0.6 mg/dL (ref 0.3–1.2)

## 2017-04-18 LAB — URINALYSIS, ROUTINE W REFLEX MICROSCOPIC
BILIRUBIN URINE: NEGATIVE
Glucose, UA: NEGATIVE mg/dL
HGB URINE DIPSTICK: NEGATIVE
KETONES UR: NEGATIVE mg/dL
Leukocytes, UA: NEGATIVE
NITRITE: NEGATIVE
PROTEIN: NEGATIVE mg/dL
SPECIFIC GRAVITY, URINE: 1.027 (ref 1.005–1.030)
pH: 5 (ref 5.0–8.0)

## 2017-04-18 LAB — CBC
HCT: 43 % (ref 39.0–52.0)
HEMOGLOBIN: 14.4 g/dL (ref 13.0–17.0)
MCH: 29.9 pg (ref 26.0–34.0)
MCHC: 33.5 g/dL (ref 30.0–36.0)
MCV: 89.2 fL (ref 78.0–100.0)
PLATELETS: 283 10*3/uL (ref 150–400)
RBC: 4.82 MIL/uL (ref 4.22–5.81)
RDW: 13.7 % (ref 11.5–15.5)
WBC: 7.7 10*3/uL (ref 4.0–10.5)

## 2017-04-18 LAB — LIPASE, BLOOD: Lipase: 50 U/L (ref 11–51)

## 2017-04-18 MED ORDER — ONDANSETRON HCL 4 MG/2ML IJ SOLN
4.0000 mg | Freq: Once | INTRAMUSCULAR | Status: AC
  Administered 2017-04-18: 4 mg via INTRAVENOUS
  Filled 2017-04-18: qty 2

## 2017-04-18 MED ORDER — FENTANYL CITRATE (PF) 100 MCG/2ML IJ SOLN
50.0000 ug | Freq: Once | INTRAMUSCULAR | Status: AC
  Administered 2017-04-18: 50 ug via INTRAVENOUS
  Filled 2017-04-18: qty 2

## 2017-04-18 MED ORDER — IOPAMIDOL (ISOVUE-300) INJECTION 61%
INTRAVENOUS | Status: AC
  Administered 2017-04-18: 100 mL
  Filled 2017-04-18: qty 100

## 2017-04-18 MED ORDER — SODIUM CHLORIDE 0.9 % IV BOLUS (SEPSIS): 1000.0000 mL | Freq: Once | INTRAVENOUS | Status: DC

## 2017-04-18 MED ORDER — SODIUM CHLORIDE 0.9 % IV BOLUS (SEPSIS)
1000.0000 mL | Freq: Once | INTRAVENOUS | Status: AC
  Administered 2017-04-18: 1000 mL via INTRAVENOUS

## 2017-04-18 MED ORDER — ONDANSETRON 4 MG PO TBDP: 4.0000 mg | ORAL_TABLET | Freq: Three times a day (TID) | ORAL | 0 refills | Status: DC | PRN

## 2017-04-18 MED ORDER — DICYCLOMINE HCL 20 MG PO TABS: 20.0000 mg | ORAL_TABLET | Freq: Two times a day (BID) | ORAL | 0 refills | Status: DC

## 2017-04-18 NOTE — ED Provider Notes (Signed)
[facility]-EMERGENCY DEPT Provider Note   CSN: [id number] Arrival date & time: 04/18/17  0402     History   Chief Complaint Chief Complaint  Patient presents with  . Abdominal Pain    HPI Edwin Reyes is a 51 y.o. male.  Patient presents with progressively worsening right-sided abdominal pain for the past 3 days. This is associated with multiple episodes of diarrhea about 3 times daily. Patient reports sometimes he feels like he needs to go to the bathroom and nothing comes out. At one episode of vomiting 2 days ago but none since. No fever. No dysuria hematuria. No recent travel or sick contacts. Nothing to eat or drink since 6 PM last night. Has had a poor appetite. Denies any recent antibiotic use. No previous abdominal surgeries.   The history is provided by the patient.  Abdominal Pain   Associated symptoms include diarrhea, nausea and vomiting. Pertinent negatives include fever, dysuria, hematuria, headaches, arthralgias and myalgias.    Past Medical History:  Diagnosis Date  . Hypertension     Patient Active Problem List   Diagnosis Date Noted  . Essential hypertension 09/02/2015  . Obesity 09/02/2015  . Pain, dental 09/02/2015  . Gum inflammation 09/02/2015    Past Surgical History:  Procedure Laterality Date  . sutures     staples in groin        Home Medications    Prior to Admission medications   Medication Sig Start Date End Date Taking? Authorizing Provider  cycloSPORINE (RESTASIS) 0.05 % ophthalmic emulsion 1 drop 2 (two) times daily.   Yes [provider]  amLODipine (NORVASC) 5 MG tablet Take 1 tablet (5 mg total) by mouth daily. Must see personal physician for additional refills 11/17/16   Hayden Rasmussen, NP    Family History No family history on file.  Social History Social History  Substance Use Topics  . Smoking status: Current Every Day Smoker    Packs/day: 0.50    Years: 30.00    Types: Cigarettes  . Smokeless tobacco: Never Used   . Alcohol use No     Allergies   Patient has no known allergies.   Review of Systems Review of Systems  Constitutional: Positive for activity change and appetite change. Negative for fever.  HENT: Negative for congestion and nosebleeds.   Eyes: Negative for visual disturbance.  Respiratory: Negative for cough, chest tightness and shortness of breath.   Cardiovascular: Negative for chest pain.  Gastrointestinal: Positive for abdominal pain, diarrhea, nausea and vomiting.  Genitourinary: Negative for dysuria, hematuria and testicular pain.  Musculoskeletal: Negative for arthralgias, back pain and myalgias.  Skin: Negative for rash.  Neurological: Negative for dizziness, weakness and headaches.   all other systems are negative except as noted in the HPI and PMH.     Physical Exam Updated Vital Signs BP (!) 146/112 (BP Location: Left Arm)   Pulse 89   Temp 97.9 F (36.6 C) (Oral)   Resp 18   Ht  (1.803 m)   Wt 113.4 kg (250 lb)   SpO2 100%   BMI 34.87 kg/m   Physical Exam  Constitutional: He is oriented to person, place, and time. He appears well-developed and well-nourished. No distress.  HENT:  Head: Normocephalic and atraumatic.  Mouth/Throat: Oropharynx is clear and moist. No oropharyngeal exudate.  Eyes: Pupils are equal, round, and reactive to light. Conjunctivae and EOM are normal.  Neck: Normal range of motion. Neck supple.  No meningismus.  Cardiovascular: Normal rate,  regular rhythm, normal heart sounds and intact distal pulses.   No murmur heard. Pulmonary/Chest: Effort normal and breath sounds normal. No respiratory distress.  Abdominal: Soft. There is tenderness. There is guarding. There is no rebound.  TTP RUQ and RLQ with guarding.    Genitourinary:  Genitourinary Comments: No testicular tenderness  Musculoskeletal: Normal range of motion. He exhibits no edema or tenderness.  Neurological: He is alert and oriented to person, place, and time. No  cranial nerve deficit. He exhibits normal muscle tone. Coordination normal.   5/5 strength throughout. CN 2-12 intact.Equal grip strength.   Skin: Skin is warm.  Psychiatric: He has a normal mood and affect. His behavior is normal.  Nursing note and vitals reviewed.    ED Treatments / Results  Labs (all labs ordered are listed, but only abnormal results are displayed) Labs Reviewed  COMPREHENSIVE METABOLIC PANEL - Abnormal; Notable for the following:       Result Value   Glucose, Bld 133 (*)    Creatinine, Ser 1.25 (*)    Calcium 8.7 (*)    Total Protein 5.4 (*)    Albumin 3.2 (*)    All other components within normal limits  LIPASE, BLOOD  CBC  URINALYSIS, ROUTINE W REFLEX MICROSCOPIC    EKG  EKG Interpretation None       Radiology Ct Abdomen Pelvis W Contrast  Result Date: 04/18/2017 CLINICAL DATA:  51 y/o M; right lower quadrant abdominal pain, abdominal distention, diarrhea, and vomiting for 2 days. EXAM: CT ABDOMEN AND PELVIS WITH CONTRAST TECHNIQUE: Multidetector CT imaging of the abdomen and pelvis was performed using the standard protocol following bolus administration of intravenous contrast. CONTRAST:  ISOVUE-300 IOPAMIDOL (ISOVUE-300) INJECTION 61% COMPARISON:  None. FINDINGS: Lower chest: No acute abnormality. Hepatobiliary: No focal liver abnormality is seen. No gallstones, gallbladder wall thickening, or biliary dilatation. Pancreas: Unremarkable. No pancreatic ductal dilatation or surrounding inflammatory changes. Spleen: Normal in size without focal abnormality. Adrenals/Urinary Tract: Adrenal glands are unremarkable. Kidneys are normal, without renal calculi, focal lesion, or hydronephrosis. Incidental fat within the anterior bladder wall. Stomach/Bowel: Stomach is within normal limits. Appendix appears normal. No evidence of bowel wall thickening, distention, or inflammatory changes. Vascular/Lymphatic: No significant vascular findings are present. No  enlarged abdominal or pelvic lymph nodes. Reproductive: Uterus and bilateral adnexa are unremarkable. Other: No abdominal wall hernia or abnormality. No abdominopelvic ascites. Musculoskeletal: Mild osteoarthrosis of the hip joints with loss of superior joint space and fibrocystic degeneration of the acetabulum. Lumbar spondylosis greatest at the L5-S1 level where there is moderate disc space narrowing and vacuum phenomenon. No acute fracture. IMPRESSION: 1. No acute process identified as explanation for pain. 2. Mild bilateral hip and lower lumbar spine degenerative changes. Electronically Signed   By: Mitzi Hansen M.D.   On: 04/18/2017 06:01   US Abdomen Limited Ruq  Result Date: 04/18/2017 CLINICAL DATA:  Right upper quadrant pain for 3 days. EXAM: ULTRASOUND ABDOMEN LIMITED RIGHT UPPER QUADRANT COMPARISON:  CT abdomen and pelvis 04/18/2017 FINDINGS: Gallbladder: No gallstones or wall thickening visualized. No sonographic Murphy sign noted by sonographer. Common bile duct: Diameter: 3 mm, normal Liver: No focal lesion identified. Within normal limits in parenchymal echogenicity. Portal vein is patent on color Doppler imaging with normal direction of blood flow towards the liver. IMPRESSION: Normal examination. Electronically Signed   By: Burman Nieves M.D.   On: 04/18/2017 06:53    Procedures Procedures (including critical care time)  Medications Ordered in ED Medications  fentaNYL (SUBLIMAZE) injection 50 mcg (not administered)  ondansetron (ZOFRAN) injection 4 mg (not administered)  sodium chloride 0.9 % bolus 1,000 mL (not administered)  iopamidol (ISOVUE-300) 61 % injection (not administered)     Initial Impression / Assessment and Plan / ED Course  I have reviewed the triage vital signs and the nursing notes.  Pertinent labs & imaging results that were available during my care of the patient were reviewed by me and considered in my medical decision making (see chart for  details).    3 days of abdominal pain with nausea, vomiting, and diarrhea. No fever. Patient with right-sided abdominal tenderness. Concern for appendicitis.  Labs reassuring. WBC normal.  LFTs and lipase normal.  CT showed normal appendix, no other acute pathology.   [place] also shows normal gall bladder.   Will treat supportively for likely viral GI illness. No vomiting or diarrhea during ED stay. Reassurance provided. Patient is tolerating by mouth. Discussed that early appendicitis is still possible but seems unlikely at this point. Needs to return with worsening pain specialist right lower quadrant, fever or vomiting. Follow up with PCP. Return precautions discussed.    Final Clinical Impressions(s) / ED Diagnoses   Final diagnoses:  RUQ pain  Nausea vomiting and diarrhea    New Prescriptions New Prescriptions   No medications on file     Glynn Octave, MD 04/18/17 [phone]

## 2017-04-18 NOTE — ED Triage Notes (Signed)
Pt reports abdominal pain, distention ongoing since Friday. States RLQ localized pain, 7/10. Reports N/V/D. Last bowel movement 1830 last night.

## 2017-04-18 NOTE — ED Notes (Signed)
Patient states he started with diarrhea and vomiting

## 2017-04-18 NOTE — Discharge Instructions (Signed)
Keep yourself hydrated. Your appendix and gallbladder appear normal, but sometimes appendicitis doesn't show up on CT right away. Follow up with your doctor. Return to the ED if you develop worsening pain, fever, vomiting, or any other concerns

## 2017-04-18 NOTE — ED Notes (Signed)
Patient c/o vomiting and diarrhea off and on since Fri.

## 2017-04-18 NOTE — ED Notes (Signed)
Return from us

## 2017-04-19 ENCOUNTER — Emergency Department (HOSPITAL_COMMUNITY)
Admission: EM | Admit: 2017-04-19 | Discharge: 2017-04-20 | Disposition: A | Discharge status: Home / Self Care | Payer: BLUE CROSS/BLUE SHIELD | Attending: Emergency Medicine | Admitting: Emergency Medicine

## 2017-04-19 DIAGNOSIS — Z5321 Procedure and treatment not carried out due to patient leaving prior to being seen by health care provider: Secondary | ICD-10-CM | POA: Insufficient documentation

## 2017-04-19 DIAGNOSIS — R109 Unspecified abdominal pain: Secondary | ICD-10-CM | POA: Diagnosis present

## 2017-04-20 ENCOUNTER — Encounter (HOSPITAL_COMMUNITY): Payer: Self-pay | Admitting: Emergency Medicine

## 2017-04-20 LAB — URINALYSIS, ROUTINE W REFLEX MICROSCOPIC
BILIRUBIN URINE: NEGATIVE
Glucose, UA: NEGATIVE mg/dL
HGB URINE DIPSTICK: NEGATIVE
KETONES UR: NEGATIVE mg/dL
Leukocytes, UA: NEGATIVE
Nitrite: NEGATIVE
Protein, ur: NEGATIVE mg/dL
SPECIFIC GRAVITY, URINE: 1.024 (ref 1.005–1.030)
pH: 6 (ref 5.0–8.0)

## 2017-04-20 LAB — COMPREHENSIVE METABOLIC PANEL
ALBUMIN: 3.3 g/dL — AB (ref 3.5–5.0)
ALK PHOS: 67 U/L (ref 38–126)
ALT: 18 U/L (ref 17–63)
AST: 27 U/L (ref 15–41)
Anion gap: 9 (ref 5–15)
BUN: 9 mg/dL (ref 6–20)
CALCIUM: 8.6 mg/dL — AB (ref 8.9–10.3)
CO2: 25 mmol/L (ref 22–32)
CREATININE: 1.1 mg/dL (ref 0.61–1.24)
Chloride: 104 mmol/L (ref 101–111)
GFR calc non Af Amer: >60 mL/min (ref >60)
GLUCOSE: 111 mg/dL — AB (ref 65–99)
Potassium: 3.3 mmol/L — ABNORMAL LOW (ref 3.5–5.1)
Sodium: 138 mmol/L (ref 135–145)
Total Bilirubin: 0.5 mg/dL (ref 0.3–1.2)
Total Protein: 5.4 g/dL — ABNORMAL LOW (ref 6.5–8.1)

## 2017-04-20 LAB — CBC WITH DIFFERENTIAL/PLATELET
Basophils Absolute: 0 10*3/uL (ref 0.0–0.1)
Basophils Relative: 1 %
Eosinophils Absolute: 0.2 10*3/uL (ref 0.0–0.7)
Eosinophils Relative: 3 %
HCT: 42.2 % (ref 39.0–52.0)
HEMOGLOBIN: 14.3 g/dL (ref 13.0–17.0)
LYMPHS ABS: 2.5 10*3/uL (ref 0.7–4.0)
Lymphocytes Relative: 33 %
MCH: 29.9 pg (ref 26.0–34.0)
MCHC: 33.9 g/dL (ref 30.0–36.0)
MCV: 88.3 fL (ref 78.0–100.0)
Monocytes Absolute: 0.6 10*3/uL (ref 0.1–1.0)
Monocytes Relative: 8 %
Neutro Abs: 4.3 10*3/uL (ref 1.7–7.7)
Neutrophils Relative %: 55 %
Platelets: 300 10*3/uL (ref 150–400)
RBC: 4.78 MIL/uL (ref 4.22–5.81)
RDW: 13.7 % (ref 11.5–15.5)
WBC: 7.6 10*3/uL (ref 4.0–10.5)

## 2017-04-20 NOTE — ED Triage Notes (Signed)
Pt c/o RLQ pain and pain in lower back onset yesterday.  Pt st's he was seen here yesterday for same.  Pt st's pain as not gotten any better.  Pt also c/o diarrhea

## 2017-04-20 NOTE — ED Notes (Signed)
Came to nurse first , states he was leaving wasn't waiting any longer.

## 2018-12-08 IMAGING — CT CT ABD-PELV W/ CM
2 of 5 series · 16 of 46 positions shown, 18 images · IV contrast (iopamidol)
Comparison: None.

CLINICAL DATA: 51 y/o M; right lower quadrant abdominal pain,
abdominal distention, diarrhea, and vomiting for 2 days.

EXAM:
CT ABDOMEN AND PELVIS WITH CONTRAST
TECHNIQUE: Multidetector CT imaging of the abdomen and pelvis was performed
using the standard protocol following bolus administration of
intravenous contrast.
CONTRAST:  100mL FISYRD-TQQ IOPAMIDOL (FISYRD-TQQ) INJECTION 61%

[Series 3: a/p w/ 5mm · axial · 0.96mm/px · z∈[+868,+1258]mm · 13 of 92 slices shown, 15 images]
[im 7/92  soft-tissue]
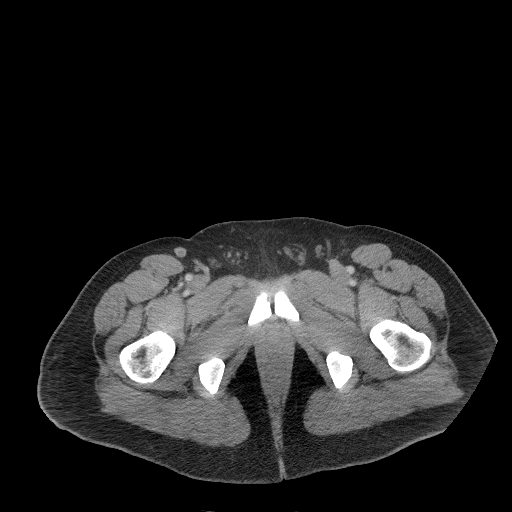
[im 7/92  bone]
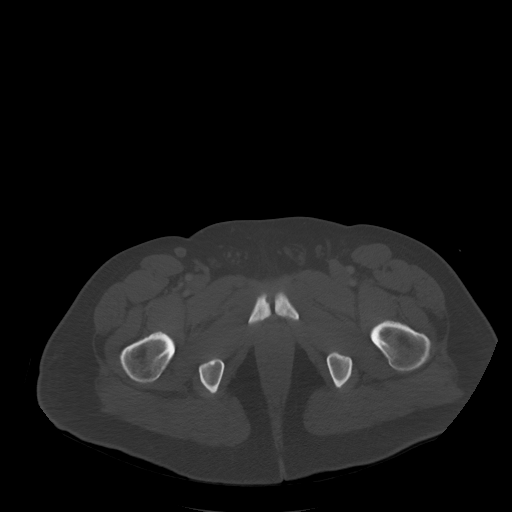
[im 13/92  soft-tissue]
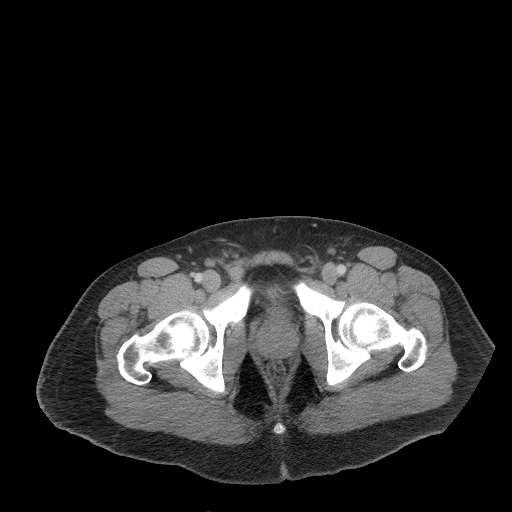
[im 19/92  soft-tissue]
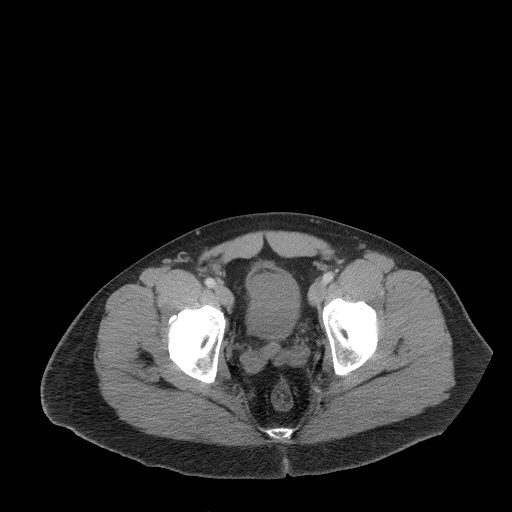
[im 25/92  soft-tissue]
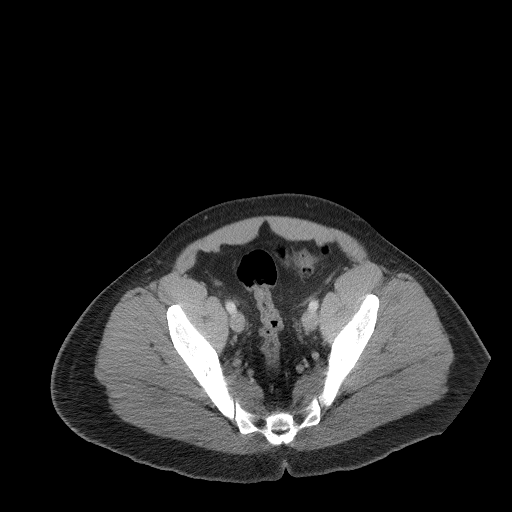
[im 31/92  soft-tissue]
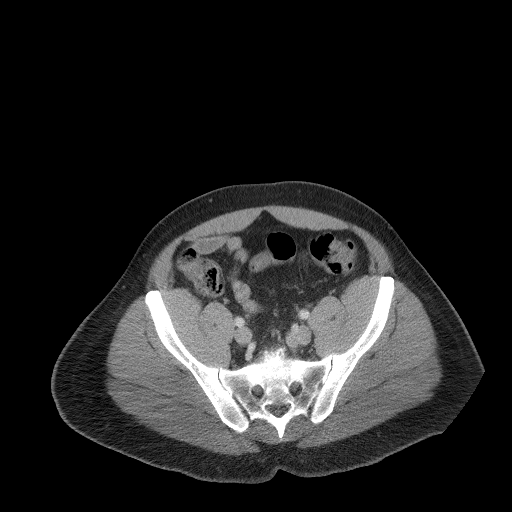
[im 37/92  soft-tissue]
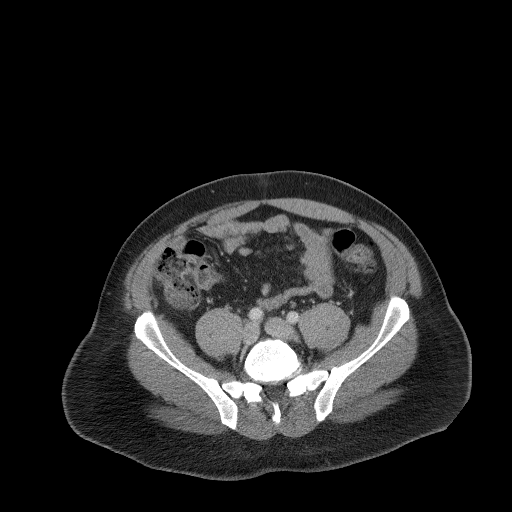
[im 49/92  soft-tissue]
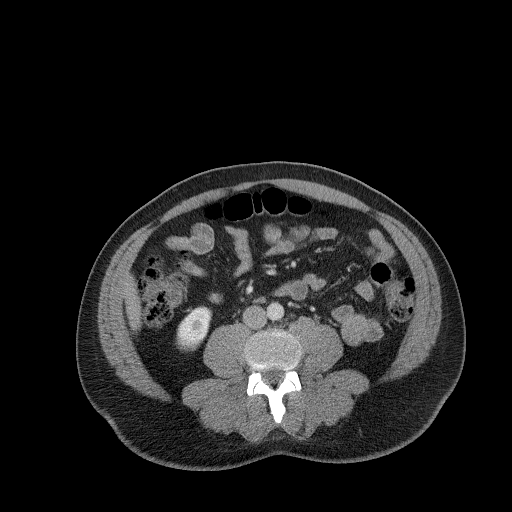
[im 55/92  soft-tissue]
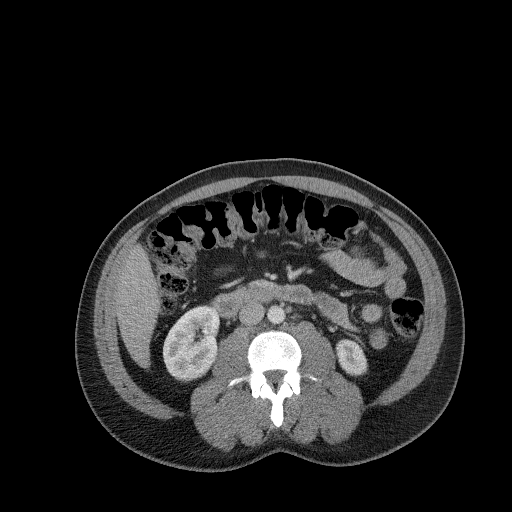
[im 61/92  soft-tissue]
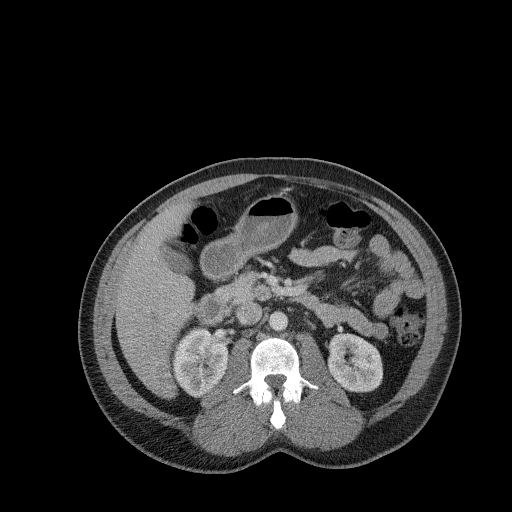
[im 61/92  bone]
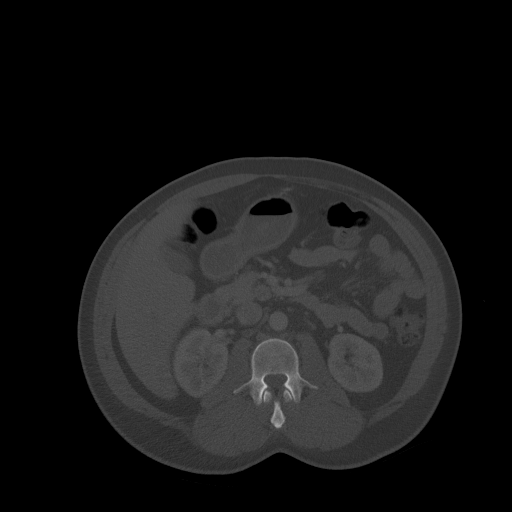
[im 67/92  soft-tissue]
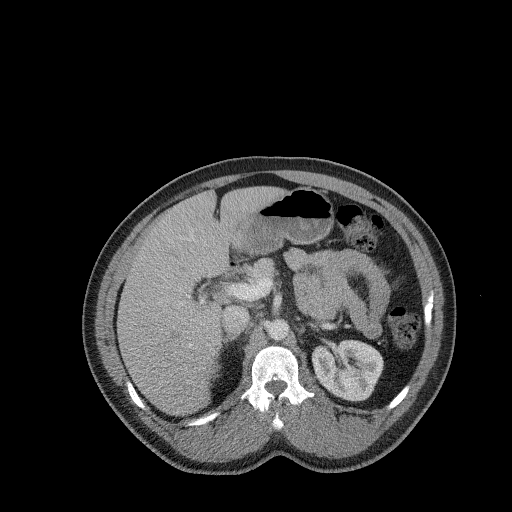
[im 73/92  soft-tissue]
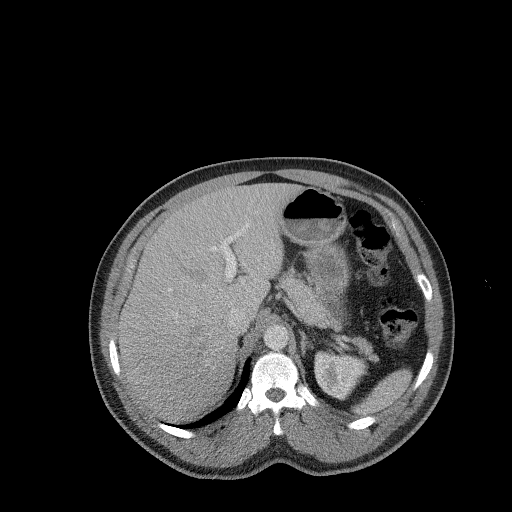
[im 79/92  soft-tissue]
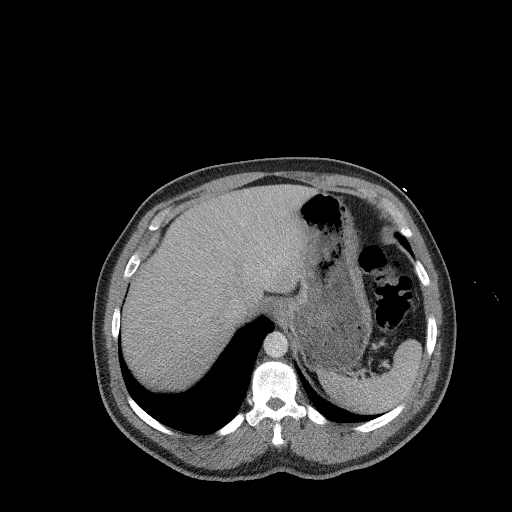
[im 85/92  soft-tissue]
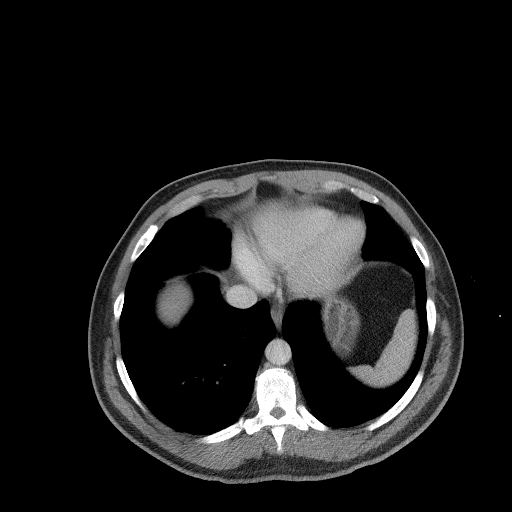

[Series 6: a/p w/ cor · coronal · 0.79mm/px · 3 of 148 slices shown]
[im 50/148  soft-tissue]
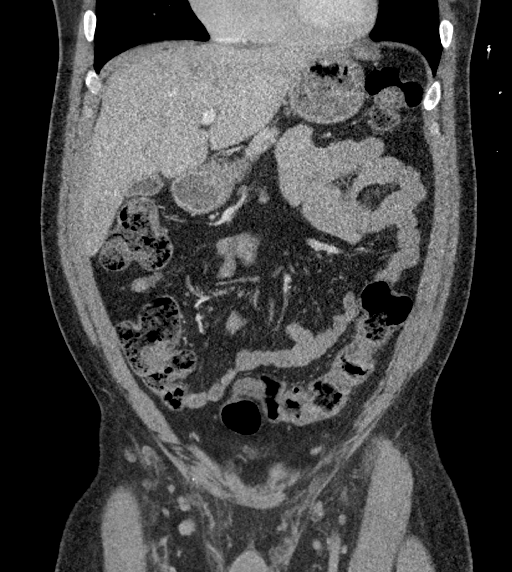
[im 66/148  soft-tissue]
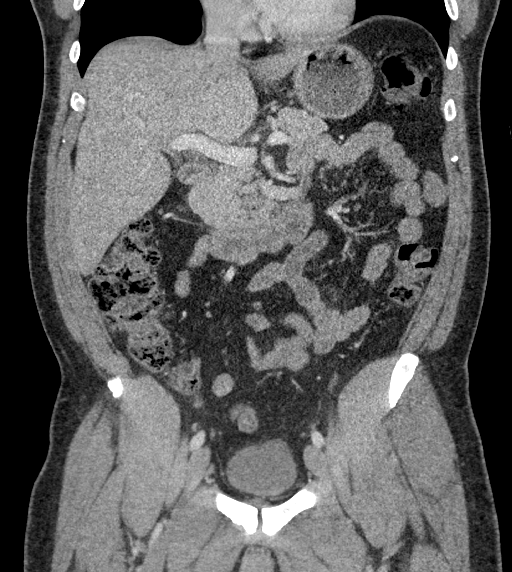
[im 82/148  soft-tissue]
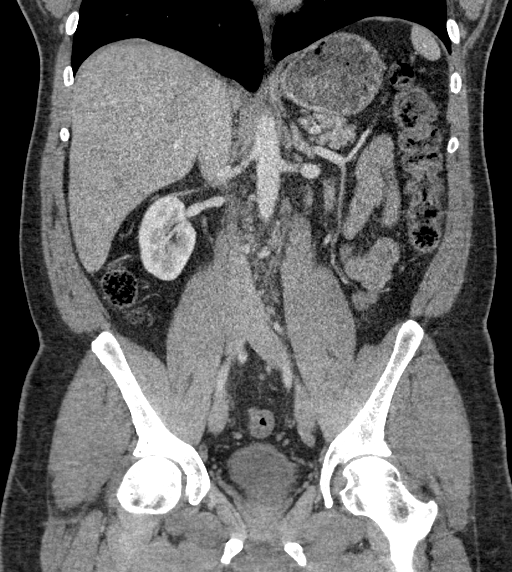

[16 of 46 positions shown; findings below may reference images not displayed]

FINDINGS: Lower chest: No acute abnormality.

Hepatobiliary: No focal liver abnormality is seen. No gallstones,
gallbladder wall thickening, or biliary dilatation.

Pancreas: Unremarkable. No pancreatic ductal dilatation or
surrounding inflammatory changes.

Spleen: Normal in size without focal abnormality.

Adrenals/Urinary Tract: Adrenal glands are unremarkable. Kidneys are
normal, without renal calculi, focal lesion, or hydronephrosis.
Incidental fat within the anterior bladder wall.

Stomach/Bowel: Stomach is within normal limits. Appendix appears
normal. No evidence of bowel wall thickening, distention, or
inflammatory changes.

Vascular/Lymphatic: No significant vascular findings are present. No
enlarged abdominal or pelvic lymph nodes.

Reproductive: Uterus and bilateral adnexa are unremarkable.

Other: No abdominal wall hernia or abnormality. No abdominopelvic
ascites.

Musculoskeletal: Mild osteoarthrosis of the hip joints with loss of
superior joint space and fibrocystic degeneration of the acetabulum.
Lumbar spondylosis greatest at the L5-S1 level where there is
moderate disc space narrowing and vacuum phenomenon. No acute
fracture.
IMPRESSION: 1. No acute process identified as explanation for pain.
2. Mild bilateral hip and lower lumbar spine degenerative changes.

By: Kizzy Rojano M.D.

## 2019-03-16 IMAGING — US US ABDOMEN LIMITED
1 series · 14 of 25 positions shown · non-contrast
Comparison: CT abdomen and pelvis 04/18/2017

CLINICAL DATA: Right upper quadrant pain for 3 days.

EXAM:
ULTRASOUND ABDOMEN LIMITED RIGHT UPPER QUADRANT

[Series 1: us abdomen limited · 0.26mm/px · 14 of 48 slices shown]
[im 1/48]
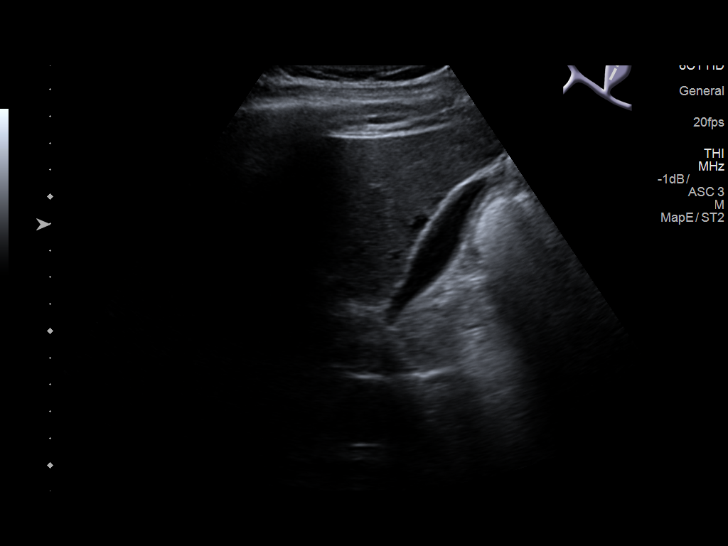
[im 4/48]
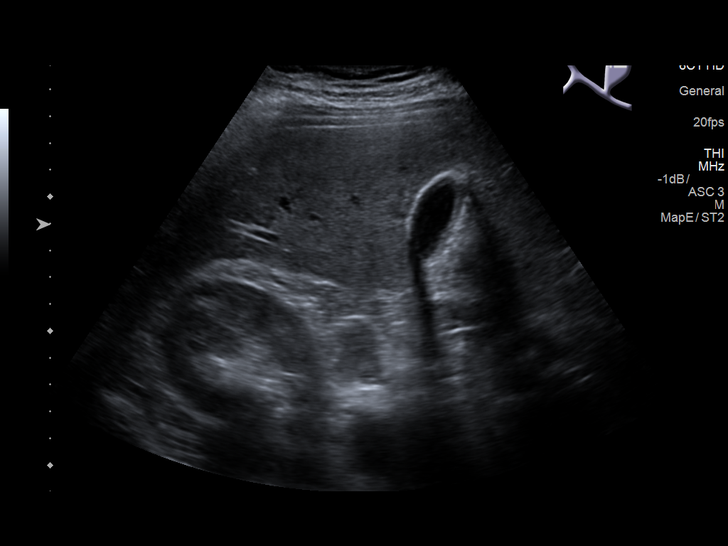
[im 8/48]
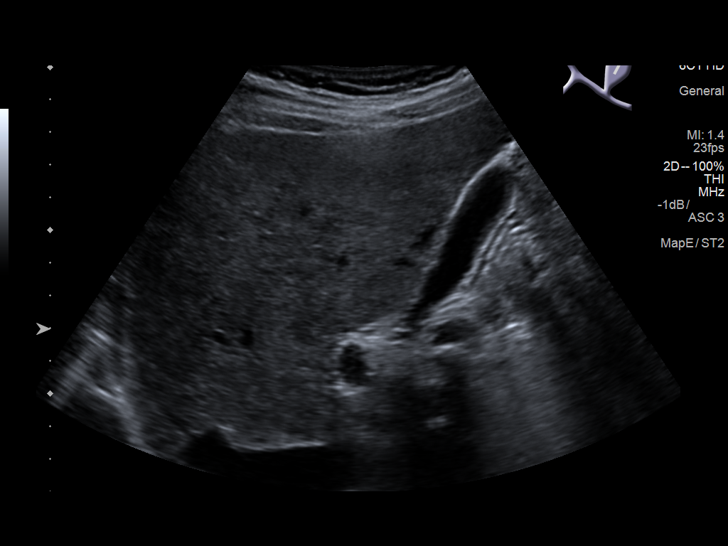
[im 12/48]
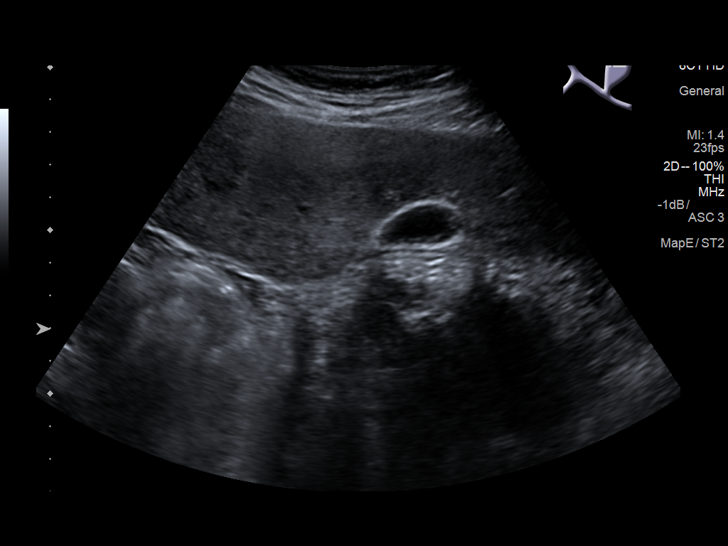
[im 16/48]
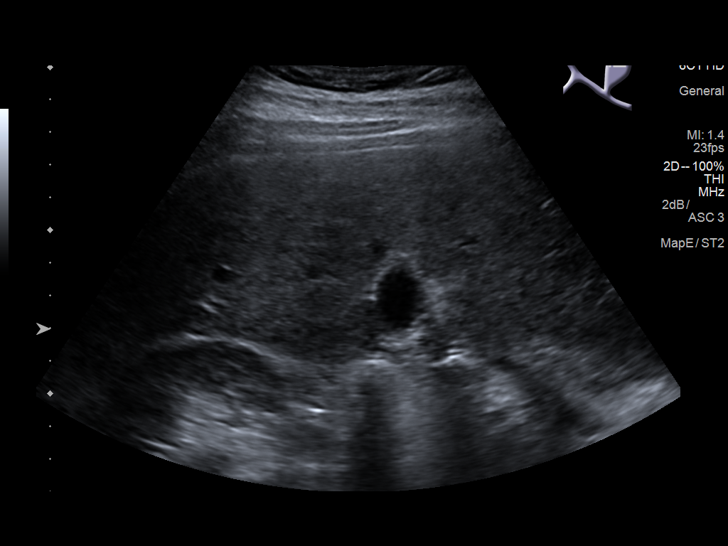
[im 18/48]
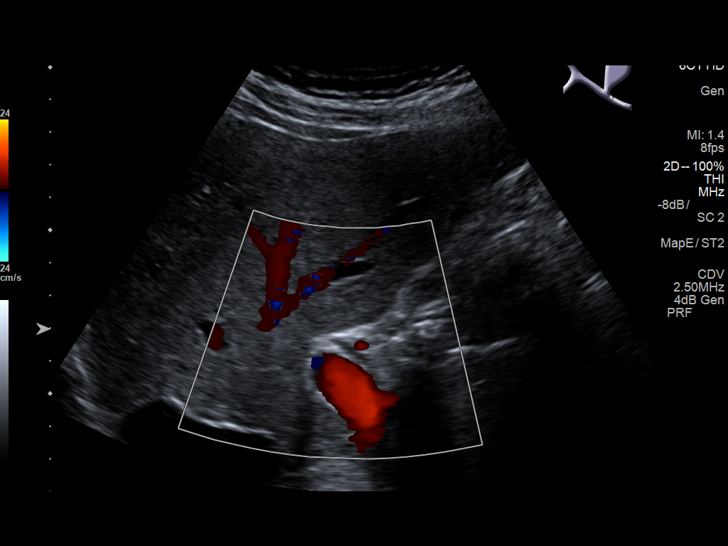
[im 22/48]
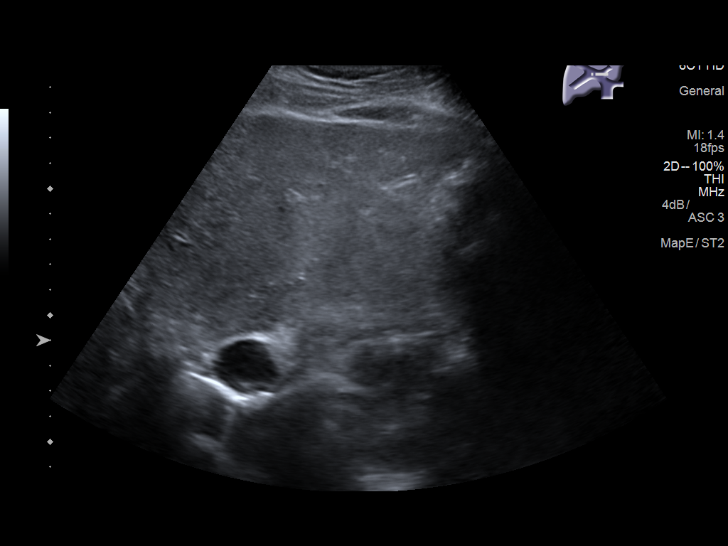
[im 26/48]
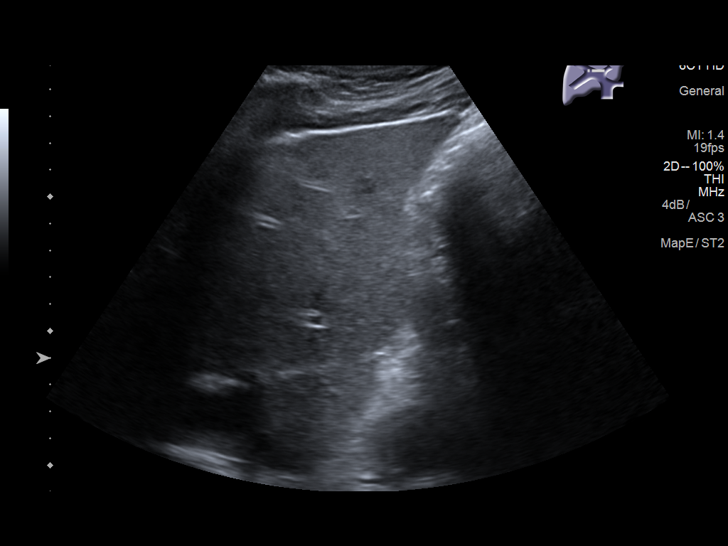
[im 30/48]
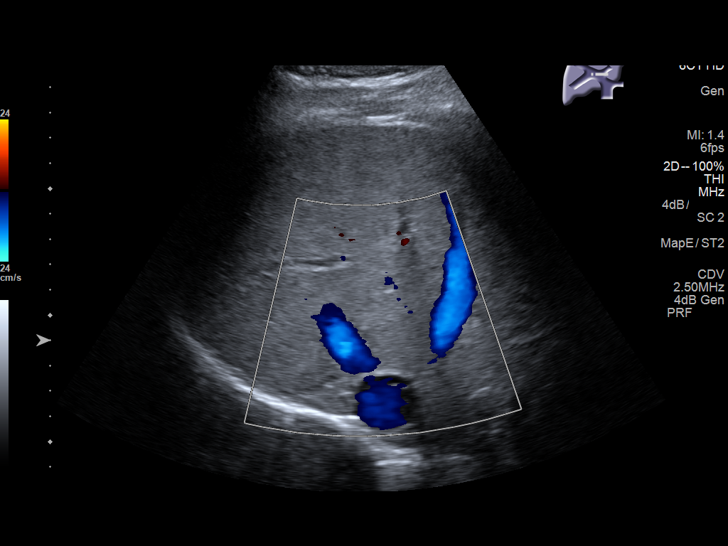
[im 32/48]
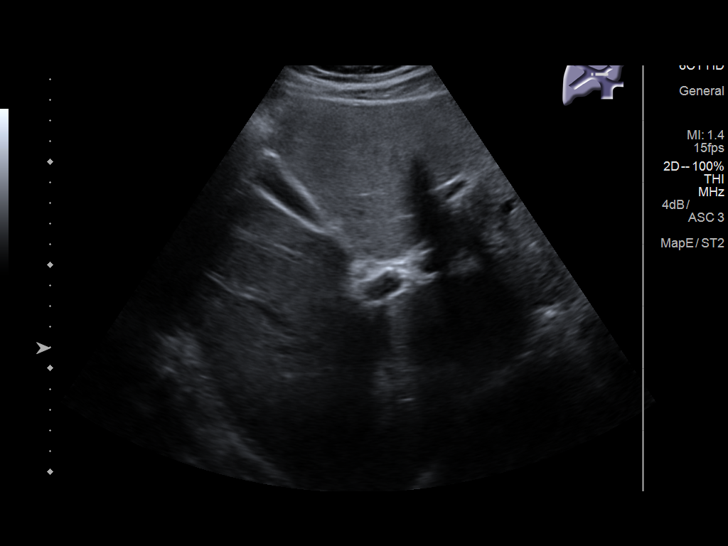
[im 36/48]
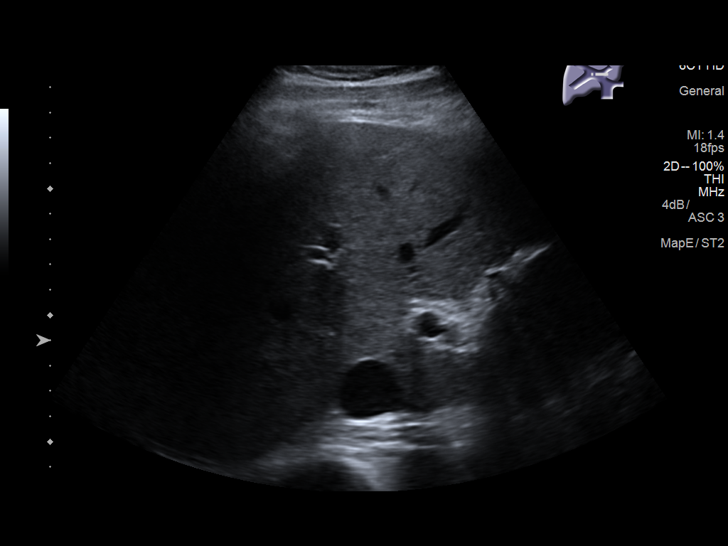
[im 40/48]
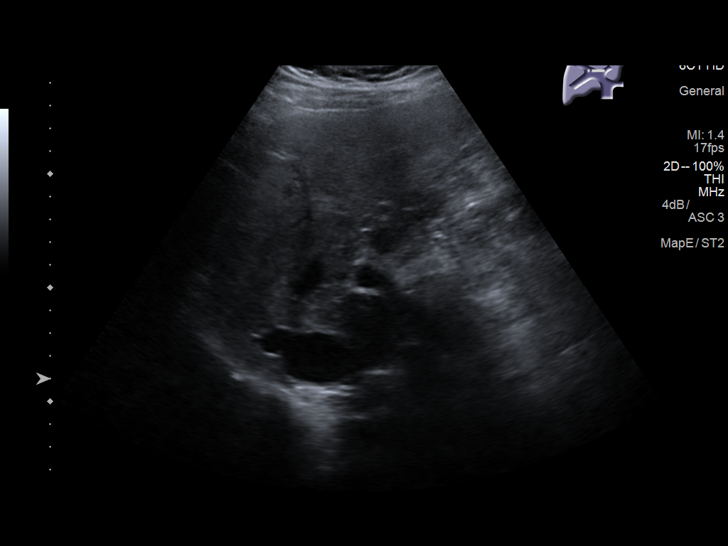
[im 44/48]
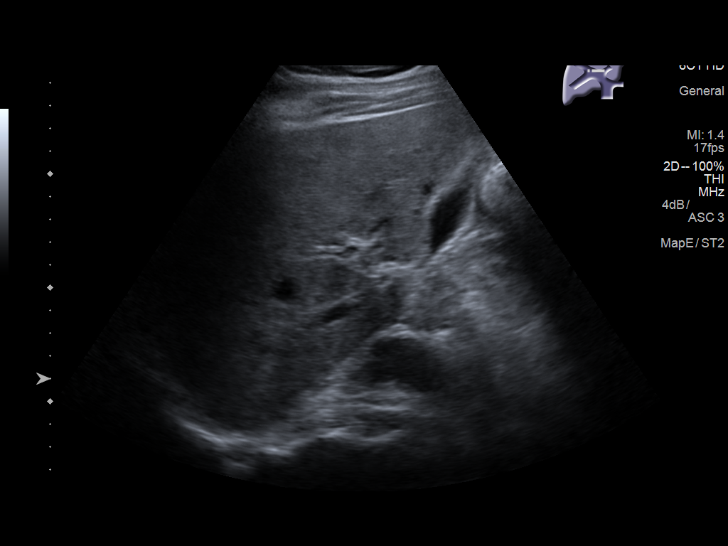
[im 48/48]
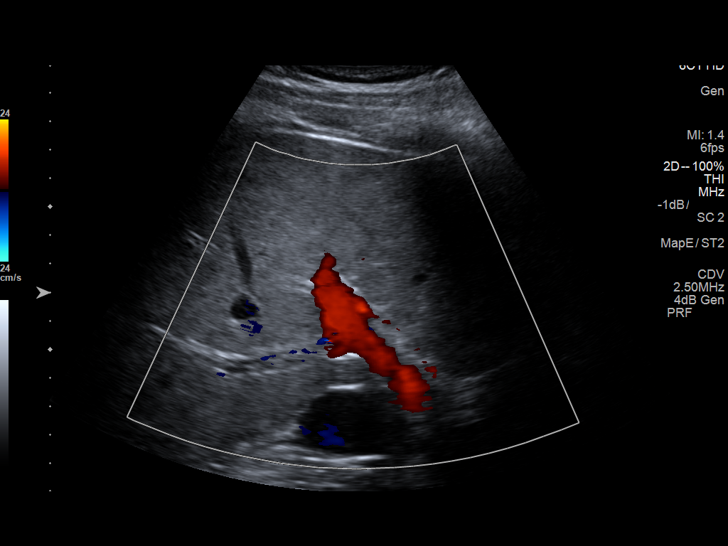

[14 of 25 positions shown; findings below may reference images not displayed]

FINDINGS: Gallbladder:

No gallstones or wall thickening visualized. No sonographic Murphy
sign noted by sonographer.

Common bile duct:

Diameter: 3 mm, normal

Liver:

No focal lesion identified. Within normal limits in parenchymal
echogenicity. Portal vein is patent on color Doppler imaging with
normal direction of blood flow towards the liver.
IMPRESSION: Normal examination.

## 2022-08-07 ENCOUNTER — Emergency Department: Payer: BLUE CROSS/BLUE SHIELD

## 2022-08-07 ENCOUNTER — Encounter: Payer: Self-pay | Admitting: Emergency Medicine

## 2022-08-07 DIAGNOSIS — M79602 Pain in left arm: Secondary | ICD-10-CM | POA: Insufficient documentation

## 2022-08-07 DIAGNOSIS — R0789 Other chest pain: Secondary | ICD-10-CM | POA: Insufficient documentation

## 2022-08-07 DIAGNOSIS — I10 Essential (primary) hypertension: Secondary | ICD-10-CM | POA: Insufficient documentation

## 2022-08-07 LAB — CBC
HCT: 45.8 % (ref 39.0–52.0)
Hemoglobin: 15.4 g/dL (ref 13.0–17.0)
MCH: 29.1 pg (ref 26.0–34.0)
MCHC: 33.6 g/dL (ref 30.0–36.0)
MCV: 86.4 fL (ref 80.0–100.0)
Platelets: 340 10*3/uL (ref 150–400)
RBC: 5.3 MIL/uL (ref 4.22–5.81)
RDW: 12.5 % (ref 11.5–15.5)
WBC: 9.7 10*3/uL (ref 4.0–10.5)
nRBC: 0 % (ref 0.0–0.2)

## 2022-08-07 NOTE — ED Triage Notes (Signed)
Pt presents via POV with complaints of midsternal CP with radiation to the L arm that started ~2-3 hours ago while trying to go to sleep. Pt appears visibly uncomfortable. EKG obtained. Rates the pain 10/10 - describes it as a "sharp/burning sensation" under his L armpit. Denies N/V/D, fevers, chills SOB.

## 2022-08-08 ENCOUNTER — Emergency Department
Admission: EM | Admit: 2022-08-08 | Discharge: 2022-08-08 | Disposition: A | Discharge status: Home / Self Care | Payer: BLUE CROSS/BLUE SHIELD | Attending: Emergency Medicine | Admitting: Emergency Medicine

## 2022-08-08 DIAGNOSIS — R0789 Other chest pain: Secondary | ICD-10-CM

## 2022-08-08 HISTORY — DX: Hyperlipidemia, unspecified: E78.5

## 2022-08-08 LAB — BASIC METABOLIC PANEL
Anion gap: 6 (ref 5–15)
BUN: 11 mg/dL (ref 6–20)
CO2: 28 mmol/L (ref 22–32)
Calcium: 8.8 mg/dL — ABNORMAL LOW (ref 8.9–10.3)
Chloride: 105 mmol/L (ref 98–111)
Creatinine, Ser: 0.99 mg/dL (ref 0.61–1.24)
GFR, Estimated: >60 mL/min (ref >60)
Glucose, Bld: 138 mg/dL — ABNORMAL HIGH (ref 70–99)
Potassium: 3.5 mmol/L (ref 3.5–5.1)
Sodium: 139 mmol/L (ref 135–145)

## 2022-08-08 LAB — TROPONIN I (HIGH SENSITIVITY)
Troponin I (High Sensitivity): 6 ng/L (ref <18)
Troponin I (High Sensitivity): 6 ng/L (ref <18)

## 2022-08-08 LAB — D-DIMER, QUANTITATIVE: D-Dimer, Quant: 0.38 ug/mL-FEU (ref 0.00–0.50)

## 2022-08-08 MED ORDER — OXYCODONE-ACETAMINOPHEN 5-325 MG PO TABS
2.0000 | ORAL_TABLET | Freq: Once | ORAL | Status: AC
  Administered 2022-08-08: 2 via ORAL
  Filled 2022-08-08: qty 2

## 2022-08-08 MED ORDER — ASPIRIN 81 MG PO CHEW
324.0000 mg | CHEWABLE_TABLET | Freq: Once | ORAL | Status: AC
  Administered 2022-08-08: 324 mg via ORAL
  Filled 2022-08-08: qty 4

## 2022-08-08 MED ORDER — KETOROLAC TROMETHAMINE 30 MG/ML IJ SOLN
15.0000 mg | Freq: Once | INTRAMUSCULAR | Status: AC
  Administered 2022-08-08: 15 mg via INTRAVENOUS
  Filled 2022-08-08: qty 1

## 2022-08-08 NOTE — ED Provider Notes (Signed)
[facility] Provider Note    Event Date/Time   First MD Initiated Contact with Patient 08/08/22 0217     (approximate)   History   Chest Pain   HPI  Edwin Reyes is a 57 y.o. male whose medical history includes hypertension and hyperlipidemia as well as daily tobacco use.  He has no history of heart problems or lung issues.  He presents for evaluation of chest pain and pain in his left arm.  It has been going on for about 3 hours prior to arrival.  He said that he was just resting and try to go to sleep when it started with some pain in his left arm and then it went up to his chest.  He said it hurts worse when he moves around or presses on both his arm and his chest.  He traveled here by plane 1 to 2 days ago from [place], [place], to visit his kids.  He has no shortness of breath, nausea, vomiting, diarrhea, abdominal pain, fevers, and chills.  He has not had symptoms like this before.  He does not remember straining or otherwise injuring himself.     Physical Exam   Triage Vital Signs: ED Triage Vitals  Enc Vitals Group     BP 08/07/22 2338 133/87     Pulse Rate 08/07/22 2338 87     Resp 08/07/22 2338 18     Temp 08/07/22 2338 98.2 F (36.8 C)     Temp Source 08/07/22 2338 Oral     SpO2 08/07/22 2338 98 %     Weight 08/07/22 2335 117.9 kg (260 lb)     Height 08/07/22 2335 1.803 m (5\' 11" )     Head Circumference --      Peak Flow --      Pain Score 08/07/22 2335 10     Pain Loc --      Pain Edu? --      Excl. in [place]? --     Most recent vital signs: Vitals:   08/08/22 0500 08/08/22 0512  BP:  134/88  Pulse: 79 74  Resp: 16 16  Temp:    SpO2:  99%     General: Awake, no distress.  Patient was sleeping when I entered the room. CV:  Good peripheral perfusion.  Normal heart sounds.  Regular rate/rhythm Resp:  Normal effort. Speaking easily and comfortably, no accessory muscle usage nor intercostal retractions.  Lungs are clear to  auscultation bilaterally. Abd:  No distention. No tenderness to palpation Other:  Highly reproducible tenderness to palpation of the soft tissues of his left upper arm and left/central chest wall   ED Results / Procedures / Treatments   Labs (all labs ordered are listed, but only abnormal results are displayed) Labs Reviewed  BASIC METABOLIC PANEL - Abnormal; Notable for the following components:      Result Value   Glucose, Bld 138 (*)    Calcium 8.8 (*)    All other components within normal limits  CBC  D-DIMER, QUANTITATIVE  TROPONIN I (HIGH SENSITIVITY)  TROPONIN I (HIGH SENSITIVITY)     EKG  ED ECG REPORT I, Hinda Kehr, the attending physician, personally viewed and interpreted this ECG.  Date: 08/07/2022 EKG Time: 23: 37 Rate: 86 Rhythm: normal sinus rhythm QRS Axis: normal Intervals: incomplete RBBB ST/T Wave abnormalities: Non-specific ST segment / T-wave changes, but no clear evidence of acute ischemia. Narrative Interpretation: no definitive evidence of acute ischemia;  does not meet STEMI criteria.    RADIOLOGY I viewed and interpreted the patient's two-view chest x-ray.  No indication of pneumonia, pneumothorax, rib fractures, etc.  I also read the radiologist's report, which confirmed no acute findings.    PROCEDURES:  Critical Care performed: No  .1-3 Lead EKG Interpretation  Performed by: Hinda Kehr, MD Authorized by: Hinda Kehr, MD     Interpretation: normal     ECG rate:  74   ECG rate assessment: normal     Rhythm: sinus rhythm     Ectopy: none     Conduction: normal      MEDICATIONS ORDERED IN ED: Medications  ketorolac (TORADOL) 30 MG/ML injection 15 mg (15 mg Intravenous Given 08/08/22 0353)  aspirin chewable tablet 324 mg (324 mg Oral Given 08/08/22 0354)  oxyCODONE-acetaminophen (PERCOCET/ROXICET) 5-325 MG per tablet 2 tablet (2 tablets Oral Given 08/08/22 0354)     IMPRESSION / MDM / ASSESSMENT AND PLAN / ED COURSE  I  reviewed the triage vital signs and the nursing notes.                              Differential diagnosis includes, but is not limited to, ACS, PE, pneumonia, pneumothorax, musculoskeletal strain, AAS.  Patient's presentation is most consistent with acute presentation with potential threat to life or bodily function.  Lab/studies ordered: High-sensitivity troponin x 2, CBC, BMP, D-dimer, two-view chest x-ray, EKG.  The patient is on the cardiac monitor to evaluate for evidence of arrhythmia and/or significant heart rate changes.  Labs are reassuring including 2 high-sensitivity troponins and a D-dimer.  Very low risk for ACS and PE.  Additionally, patient has highly reproducible tenderness to palpation of the soft tissues of the arm and the chest wall.  While the workup was being pursued, I treated him with aspirin 324 mg, Toradol 15 mg IV, and 2 Percocet.  I reassessed him after the workup was complete and he said he feels much better.  I explained the low risk and no need to stay in the hospital.  I considered hospitalization given his risk factors, but after the reassuring workup, I think there is little benefit to staying and I referred him to cardiology as an outpatient.  He agrees with this plan.  I recommended ibuprofen and Tylenol and close follow-up and I gave my usual and customary return precautions.      FINAL CLINICAL IMPRESSION(S) / ED DIAGNOSES   Final diagnoses:  Atypical chest pain     Rx / DC Orders   ED Discharge Orders          Ordered    Ambulatory referral to Cardiology       Comments: If you have not heard from the Cardiology office within the next 72 hours please call [phone].   08/08/22 0457             Note:  This document was prepared using Dragon voice recognition software and may include unintentional dictation errors.   Hinda Kehr, MD 08/08/22 [phone]

## 2022-08-08 NOTE — Discharge Instructions (Signed)

## 2022-09-15 ENCOUNTER — Encounter (HOSPITAL_COMMUNITY): Payer: Self-pay | Admitting: Emergency Medicine

## 2022-09-15 ENCOUNTER — Other Ambulatory Visit: Payer: Self-pay

## 2022-09-15 ENCOUNTER — Ambulatory Visit (HOSPITAL_COMMUNITY)
Admission: EM | Admit: 2022-09-15 | Discharge: 2022-09-15 | Disposition: A | Discharge status: Home / Self Care | Payer: Self-pay | Attending: Family Medicine | Admitting: Family Medicine

## 2022-09-15 DIAGNOSIS — K529 Noninfective gastroenteritis and colitis, unspecified: Secondary | ICD-10-CM

## 2022-09-15 MED ORDER — DIPHENOXYLATE-ATROPINE 2.5-0.025 MG PO TABS
1.0000 | ORAL_TABLET | Freq: Four times a day (QID) | ORAL | 0 refills | Status: AC | PRN
  Filled 2022-09-15: qty 16, 4d supply, fill #0

## 2022-09-15 NOTE — Discharge Instructions (Addendum)
Lomotil--1 tablet by mouth 4 times daily as needed for diarrhea or loose stools; this is a send sent to the pharmacy.  Generic for Imodium over-the-counter -loperamide may be enough to control the diarrhea  You can use the QR code/website at the back of the summary paperwork to schedule yourself a new patient appointment with primary care

## 2022-09-15 NOTE — ED Triage Notes (Signed)
Patient reports diarrhea for 3 days.  Patient reports recently moving to the area 2 months ago.  Patient reports 4 stools since mid night.    Patient has not taken any medications for symptoms

## 2022-09-15 NOTE — ED Provider Notes (Signed)
[facility]    CSN: [id number] Arrival date & time: 09/15/22  1326      History   Chief Complaint Chief Complaint  Patient presents with   Diarrhea    HPI Edwin Reyes is a 57 y.o. male.    Diarrhea  Here for diarrhea that began on the evening of February 24.  He has been having 4-5 loose stools a day, with fecal urgency.  No blood in the stool.  No nausea or vomiting or fever or chills.  No upper respiratory symptoms.  He does have a history of hypertension and has not found primary care since he is moved here  Past Medical History:  Diagnosis Date   Hyperlipidemia    Hypertension     Patient Active Problem List   Diagnosis Date Noted   Essential hypertension 09/02/2015   Obesity 09/02/2015   Pain, dental 09/02/2015   Gum inflammation 09/02/2015    Past Surgical History:  Procedure Laterality Date   sutures     staples in groin        Home Medications    Prior to Admission medications   Medication Sig Start Date End Date Taking? Authorizing Provider  diphenoxylate-atropine (LOMOTIL) 2.5-0.025 MG tablet Take 1 tablet by mouth 4 (four) times daily as needed for diarrhea or loose stools. 09/15/22  Yes Barrett Henle, MD  amLODipine (NORVASC) 5 MG tablet Take 1 tablet (5 mg total) by mouth daily. Must see personal physician for additional refills Patient not taking: Reported on 09/15/2022 11/17/16   Janne Napoleon, NP    Family History History reviewed. No pertinent family history.  Social History Social History   Tobacco Use   Smoking status: Every Day    Packs/day: 0.50    Years: 30.00    Total pack years: 15.00    Types: Cigarettes   Smokeless tobacco: Never  Vaping Use   Vaping Use: Never used  Substance Use Topics   Alcohol use: No   Drug use: Yes    Types: Marijuana     Allergies   Patient has no known allergies.   Review of Systems Review of Systems  Gastrointestinal:  Positive for diarrhea.     Physical  Exam Triage Vital Signs ED Triage Vitals  Enc Vitals Group     BP 09/15/22 1513 (!) 153/100     Pulse Rate 09/15/22 1513 68     Resp 09/15/22 1513 (!) 22     Temp 09/15/22 1513 98.6 F (37 C)     Temp Source 09/15/22 1513 Oral     SpO2 09/15/22 1513 98 %     Weight --      Height --      Head Circumference --      Peak Flow --      Pain Score 09/15/22 1510 6     Pain Loc --      Pain Edu? --      Excl. in [place]? --    No data found.  Updated Vital Signs BP (!) 147/79 (BP Location: Left Arm) Comment (BP Location): large cuff, repositioned patient  Pulse 68   Temp 98.6 F (37 C) (Oral)   Resp (!) 22   SpO2 98%   Visual Acuity Right Eye Distance:   Left Eye Distance:   Bilateral Distance:    Right Eye Near:   Left Eye Near:    Bilateral Near:     Physical Exam Vitals reviewed.  Constitutional:  General: He is not in acute distress.    Appearance: He is not ill-appearing, toxic-appearing or diaphoretic.  HENT:     Mouth/Throat:     Mouth: Mucous membranes are moist.  Eyes:     Extraocular Movements: Extraocular movements intact.     Conjunctiva/sclera: Conjunctivae normal.     Pupils: Pupils are equal, round, and reactive to light.  Cardiovascular:     Rate and Rhythm: Normal rate and regular rhythm.     Heart sounds: No murmur heard. Pulmonary:     Effort: Pulmonary effort is normal.     Breath sounds: Normal breath sounds.  Abdominal:     General: Bowel sounds are normal. There is no distension.     Palpations: Abdomen is soft. There is no mass.     Tenderness: There is abdominal tenderness (Generalized). There is no guarding.  Musculoskeletal:     Cervical back: Neck supple.  Lymphadenopathy:     Cervical: No cervical adenopathy.  Skin:    Capillary Refill: Capillary refill takes less than 2 seconds.     Coloration: Skin is not jaundiced or pale.  Neurological:     General: No focal deficit present.     Mental Status: He is alert and oriented to  person, place, and time.  Psychiatric:        Behavior: Behavior normal.      UC Treatments / Results  Labs (all labs ordered are listed, but only abnormal results are displayed) Labs Reviewed - No data to display  EKG   Radiology No results found.  Procedures Procedures (including critical care time)  Medications Ordered in UC Medications - No data to display  Initial Impression / Assessment and Plan / UC Course  I have reviewed the triage vital signs and the nursing notes.  Pertinent labs & imaging results that were available during my care of the patient were reviewed by me and considered in my medical decision making (see chart for details).        I discussed with him this is most likely a viral gastroenteritis.  I have sent him some Lomotil to try, since the diarrhea is causing problems for him at his job.  I have also mentioned that Imodium may be enough.  We discussed clear liquids and bland diet and trying to orally hydrate.  He is given instructions on how to self schedule on the [facility] health website for primary care Final Clinical Impressions(s) / UC Diagnoses   Final diagnoses:  Gastroenteritis     Discharge Instructions      Lomotil--1 tablet by mouth 4 times daily as needed for diarrhea or loose stools; this is a send sent to the pharmacy.  Generic for Imodium over-the-counter -loperamide may be enough to control the diarrhea     ED Prescriptions     Medication Sig Dispense Auth. Provider   diphenoxylate-atropine (LOMOTIL) 2.5-0.025 MG tablet Take 1 tablet by mouth 4 (four) times daily as needed for diarrhea or loose stools. 16 tablet Jong Rickman, Gwenlyn Perking, MD      I have reviewed the PDMP during this encounter.   Barrett Henle, MD 09/15/22 [phone]

## 2022-09-21 ENCOUNTER — Other Ambulatory Visit: Payer: Self-pay

## 2022-11-25 ENCOUNTER — Ambulatory Visit: Payer: Self-pay | Admitting: Family Medicine

## 2022-11-25 NOTE — Progress Notes (Deleted)
Patient name: Edwin Reyes Medical record number: [id number] Date of Birth: 1966/03/02 Age: 57 y.o. Gender: male  HPI:  Edwin Reyes is a very pleasant 57 y.o. male who presents today to establish care.  PMHx: Past Medical History:  Diagnosis Date   Hyperlipidemia    Hypertension     Surgical Hx: Past Surgical History:  Procedure Laterality Date   sutures     staples in groin     Family Hx: No family history on file.  Social Hx: Current Social History   Who lives at home: *** 11/25/2022  Who would speak for you about health care matters: *** 11/25/2022  Transportation: *** 11/25/2022 Important Relationships & Pets: *** 11/25/2022  Current Stressors: *** 11/25/2022 Work / Education:  *** 11/25/2022 Religious / Personal Beliefs: *** 11/25/2022 Interests / Fun: *** 11/25/2022  Medications:  Preventative Screening Colonoscopy: year*** results *** Mammogram: year*** results *** Pap test: year*** results *** PSA: year*** results *** DEXA: year*** results *** Tetanus vaccine: year*** results *** Pneumonia vaccine: year*** results *** Shingles vaccine: year*** results *** Heart stress test: year*** results *** Echocardiogram: year*** results *** Xrays: year*** results *** CT/MRI: year*** results ***  Smoking status reviewed  ROS: pertinent noted in the HPI   Objective  There were no vitals taken for this visit.  Physical Exam  Assessment and Plan  There are no diagnoses linked to this encounter. No follow-ups on file.  Celine Mans, MD, PGY-1 [facility] 11:51 AM 11/25/2022

## 2023-04-04 DIAGNOSIS — I10 Essential (primary) hypertension: Secondary | ICD-10-CM | POA: Diagnosis not present

## 2023-04-04 DIAGNOSIS — M5442 Lumbago with sciatica, left side: Secondary | ICD-10-CM | POA: Diagnosis not present

## 2023-05-29 ENCOUNTER — Emergency Department
Admission: EM | Admit: 2023-05-29 | Discharge: 2023-05-30 | Disposition: A | Discharge status: Home / Self Care | Payer: 59 | Attending: Emergency Medicine | Admitting: Emergency Medicine

## 2023-05-29 ENCOUNTER — Emergency Department: Payer: Self-pay

## 2023-05-29 DIAGNOSIS — M109 Gout, unspecified: Secondary | ICD-10-CM | POA: Diagnosis not present

## 2023-05-29 DIAGNOSIS — I10 Essential (primary) hypertension: Secondary | ICD-10-CM | POA: Insufficient documentation

## 2023-05-29 DIAGNOSIS — M25462 Effusion, left knee: Secondary | ICD-10-CM | POA: Diagnosis not present

## 2023-05-29 DIAGNOSIS — M1712 Unilateral primary osteoarthritis, left knee: Secondary | ICD-10-CM | POA: Diagnosis not present

## 2023-05-29 DIAGNOSIS — M25562 Pain in left knee: Secondary | ICD-10-CM | POA: Diagnosis not present

## 2023-05-29 DIAGNOSIS — M11262 Other chondrocalcinosis, left knee: Secondary | ICD-10-CM | POA: Diagnosis not present

## 2023-05-29 HISTORY — DX: Gout, unspecified: M10.9

## 2023-05-29 LAB — SYNOVIAL CELL COUNT + DIFF, W/ CRYSTALS
Crystals, Fluid: NONE SEEN
Eosinophils-Synovial: 0 %
Lymphocytes-Synovial Fld: 0 %
Monocyte-Macrophage-Synovial Fluid: 4 %
Neutrophil, Synovial: 96 %
WBC, Synovial: 10285 /mm3 — ABNORMAL HIGH (ref 0–200)

## 2023-05-29 MED ORDER — LIDOCAINE HCL (PF) 1 % IJ SOLN
5.0000 mL | Freq: Once | INTRAMUSCULAR | Status: AC
  Administered 2023-05-29: 5 mL via INTRADERMAL
  Filled 2023-05-29: qty 5

## 2023-05-29 MED ORDER — OXYCODONE-ACETAMINOPHEN 5-325 MG PO TABS
1.0000 | ORAL_TABLET | Freq: Once | ORAL | Status: AC
  Administered 2023-05-29: 1 via ORAL
  Filled 2023-05-29: qty 1

## 2023-05-29 MED ORDER — PENTAFLUOROPROP-TETRAFLUOROETH EX AERO
INHALATION_SPRAY | Freq: Once | CUTANEOUS | Status: AC
  Administered 2023-05-29: 30 via TOPICAL
  Filled 2023-05-29: qty 30
  Filled 2023-05-29: qty 116

## 2023-05-29 MED ORDER — OXYCODONE-ACETAMINOPHEN 5-325 MG PO TABS: 1.0000 | ORAL_TABLET | ORAL | 0 refills | Status: DC | PRN

## 2023-05-29 NOTE — Discharge Instructions (Addendum)
Your x-ray showed that you have some osteoarthritis of your left knee.  Based on the findings from the fluid we sent from your joint, I believe this is the cause of your knee swelling.  Please continue to wear the knee immobilizer until evaluated by orthopedics.  You can take the Percocet every 4 hours as needed for severe pain.  Keep in mind that this medication can be sedating and you should not drive after taking it. You can take 650 mg of Tylenol every 6 hours as needed for mild to moderate pain.  Try to manage your pain first with the Tylenol and if the pain is still severe you can take Percocet.  I have also sent meloxicam to your pharmacy.  Please take this once a day for 14 days.  This is an anti-inflammatory medication in the NSAID family.  While taking this do not take any other NSAIDs like ibuprofen, naproxen, Advil or Aleve.  Please follow-up with orthopedics, Dr. Allena Katz, his information is attached.  You will need to call the office to schedule an appointment.

## 2023-05-29 NOTE — ED Triage Notes (Signed)
Pt arrived POV for left knee pain for 3 days, denies any injuries. Left knee swollen, unable to bear weight to the left leg, difficulty ambulating. Hx of gout. A&Ox4, NAD noted.

## 2023-05-29 NOTE — ED Notes (Signed)
Approx. 37ml fluid drawn from L knee by Leotis Shames, PA

## 2023-05-29 NOTE — ED Provider Notes (Signed)
[facility] Provider Note    Event Date/Time   First MD Initiated Contact with Patient 05/29/23 1927     (approximate)   History   Knee Pain   HPI  Edwin Reyes is a 57 y.o. male with history of hypertension, hyperlipidemia, gout presents emergency department complaining of left knee pain.  Patient states knee pain for at least 3 days.  No known injury.  States it swollen and he cannot bear weight.  Difficulty walking.  No fever or chills.  No recent gout flare.      Physical Exam   Triage Vital Signs: ED Triage Vitals  Encounter Vitals Group     BP 05/29/23 1916 (!) 173/102     Systolic BP Percentile --      Diastolic BP Percentile --      Pulse Rate 05/29/23 1916 83     Resp 05/29/23 1916 18     Temp 05/29/23 1916 98.1 F (36.7 C)     Temp Source 05/29/23 1916 Oral     SpO2 05/29/23 1916 98 %     Weight 05/29/23 1915 255 lb (115.7 kg)     Height 05/29/23 1915 5\' 11"  (1.803 m)     Head Circumference --      Peak Flow --      Pain Score 05/29/23 1915 10     Pain Loc --      Pain Education --      Exclude from Growth Chart --     Most recent vital signs: Vitals:   05/29/23 1916  BP: (!) 173/102  Pulse: 83  Resp: 18  Temp: 98.1 F (36.7 C)  SpO2: 98%     General: Awake, no distress.   CV:  Good peripheral perfusion. regular rate and  rhythm Resp:  Normal effort.  Abd:  No distention.   Other:  Left knee with swelling noted at the suprapatella area, decreased range of motion secondary discomfort, neurovascular intact   ED Results / Procedures / Treatments   Labs (all labs ordered are listed, but only abnormal results are displayed) Labs Reviewed  BODY FLUID CULTURE W GRAM STAIN  GLUCOSE, BODY FLUID OTHER            PROTEIN, BODY FLUID (OTHER)  SYNOVIAL CELL COUNT + DIFF, W/ CRYSTALS  URIC ACID, BODY FLUID     EKG     RADIOLOGY X-ray of the left knee    PROCEDURES:   .Joint  Aspiration/Arthrocentesis  Date/Time: 05/29/2023 9:04 PM  Performed by: Faythe Ghee, PA-C Authorized by: Faythe Ghee, PA-C   Consent:    Consent obtained:  Verbal   Consent given by:  Patient   Risks, benefits, and alternatives were discussed: yes     Risks discussed:  Bleeding, infection, pain, poor cosmetic result, nerve damage and incomplete drainage   Alternatives discussed:  No treatment Universal protocol:    Procedure explained and questions answered to patient or proxy's satisfaction: yes     Immediately prior to procedure, a time out was called: yes     Patient identity confirmed:  Verbally with patient Location:    Location:  Knee   Knee:  L knee Anesthesia:    Anesthesia method:  Local infiltration   Local anesthetic:  Lidocaine 1% w/o epi Procedure details:    Preparation: Patient was prepped and draped in usual sterile fashion     Needle gauge:  18 G   Ultrasound guidance: no  Approach:  Superior   Aspirate amount:  38   Aspirate characteristics:  Blood-tinged, clear and cloudy   Steroid injected: no     Specimen collected: yes   Post-procedure details:    Dressing:  Adhesive bandage and sterile dressing   Procedure completion:  Tolerated well, no immediate complications Comments:     Ace wrap applied Assisted by Cruz Condon, PA-C    MEDICATIONS ORDERED IN ED: Medications  oxyCODONE-acetaminophen (PERCOCET/ROXICET) 5-325 MG per tablet 1 tablet (1 tablet Oral Given 05/29/23 2020)  lidocaine (PF) (XYLOCAINE) 1 % injection 5 mL (5 mLs Intradermal Given 05/29/23 2050)  pentafluoroprop-tetrafluoroeth (GEBAUERS) aerosol (30 Applications Topical Given 05/29/23 2051)     IMPRESSION / MDM / ASSESSMENT AND PLAN / ED COURSE  I reviewed the triage vital signs and the nursing notes.                              Differential diagnosis includes, but is not limited to, effusion, bursitis, gout, septic joint, arthritis  Patient's presentation is most  consistent with acute illness / injury with system symptoms.   X-ray of the left knee  See procedure note for joint aspiration  Labs ordered on the synovial fluid   Patient was given Percocet for pain.  Care transferred to Cruz Condon, PA-C, plan is to assess cell count if concerns for septic joint call Ortho possible admission   FINAL CLINICAL IMPRESSION(S) / ED DIAGNOSES   Final diagnoses:  Effusion of left knee     Rx / DC Orders   ED Discharge Orders          Ordered    oxyCODONE-acetaminophen (PERCOCET) 5-325 MG tablet  Every 4 hours PRN        05/29/23 2202             Note:  This document was prepared using Dragon voice recognition software and may include unintentional dictation errors.    Faythe Ghee, PA-C 05/29/23 2203    Minna Antis, MD 05/29/23 2253

## 2023-05-30 MED ORDER — KETOROLAC TROMETHAMINE 15 MG/ML IJ SOLN
15.0000 mg | Freq: Once | INTRAMUSCULAR | Status: AC
  Administered 2023-05-30: 15 mg via INTRAMUSCULAR
  Filled 2023-05-30: qty 1

## 2023-05-30 MED ORDER — MELOXICAM 15 MG PO TABS: 15.0000 mg | ORAL_TABLET | Freq: Every day | ORAL | 0 refills | Status: AC

## 2023-05-30 NOTE — ED Provider Notes (Signed)
----------------------------------------- 12:23 AM on 05/30/2023 -----------------------------------------  Blood pressure (!) 173/102, pulse 83, temperature 98.1 F (36.7 C), temperature source Oral, resp. rate 18, height 5\' 11"  (1.803 m), weight 115.7 kg, SpO2 98%.  Assuming care from Greig Right, PA-C.  In short, Edwin Reyes is a 57 y.o. male with a chief complaint of Knee Pain .  Refer to the original H&P for additional details.  The current plan of care is to await synovial cell count and admit for septic joint cleanout if needed.  ____________________________________________    ED Results / Procedures / Treatments   Labs (all labs ordered are listed, but only abnormal results are displayed) Labs Reviewed  SYNOVIAL CELL COUNT + DIFF, W/ CRYSTALS - Abnormal; Notable for the following components:      Result Value   Color, Synovial YELLOW (*)    Appearance-Synovial HAZY (*)    WBC, Synovial 10,285 (*)    All other components within normal limits  BODY FLUID CULTURE W GRAM STAIN  GLUCOSE, BODY FLUID OTHER            PROTEIN, BODY FLUID (OTHER)  URIC ACID, BODY FLUID      RADIOLOGY  I personally viewed and evaluated these images as part of my medical decision making, as well as reviewing the written report by the radiologist.  ED Provider Interpretation: Images do not show any acute abnormalities but do show osteoarthritis of the left knee with a joint effusion.  DG Knee Complete 4 Views Left  Result Date: 05/29/2023 CLINICAL DATA:  Left knee pain.  Swelling. EXAM: LEFT KNEE - COMPLETE 4+ VIEW COMPARISON:  None Available. FINDINGS: No acute fracture or dislocation. There is a moderate-to-large knee joint effusion. Tricompartmental osteophytosis with medial compartment predominant joint space narrowing. Chondrocalcinosis within the medial and lateral compartments. Soft tissue swelling of the knee is noted anteriorly. IMPRESSION: 1. No acute osseous abnormality. 2.  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 Electronically Signed   By: Hart Robinsons M.D.   On: 05/29/2023 21:08     PROCEDURES:  Critical Care performed: No  Procedures   MEDICATIONS ORDERED IN ED: Medications  oxyCODONE-acetaminophen (PERCOCET/ROXICET) 5-325 MG per tablet 1 tablet (1 tablet Oral Given 05/29/23 2020)  lidocaine (PF) (XYLOCAINE) 1 % injection 5 mL (5 mLs Intradermal Given 05/29/23 2050)  pentafluoroprop-tetrafluoroeth (GEBAUERS) aerosol (30 Applications Topical Given 05/29/23 2051)  ketorolac (TORADOL) 15 MG/ML injection 15 mg (15 mg Intramuscular Given 05/30/23 0009)     IMPRESSION / MDM / ASSESSMENT AND PLAN / ED COURSE  I reviewed the triage vital signs and the nursing notes.                             57 year old male presents for swollen left knee joint.  Patient was hypertensive in triage otherwise vital signs are stable.  Differential diagnosis includes, but is not limited to, osteoarthritis, septic joint.  Patient's presentation is most consistent with acute complicated illness / injury requiring diagnostic workup.  Synovial cell count shows just over 10,000 WBCs without crystals.  This is consistent with an inflammatory cause of a knee effusion.  Patient was given a dose of Toradol while in the ED.  I will prescribe him meloxicam.  He was also given some pain medication as I expect him to be sore after having a knee aspiration.  He was placed in a knee immobilizer and instructed to follow-up with orthopedics.  He expressed concerns about the chronic nature of osteoarthritis, I explained that this can often be well-managed with anti-inflammatories, diet changes as well as exercise.  He voiced understanding, all questions were answered and he was stable at discharge. Patient is given ED precautions to return to the ED for any worsening or new symptoms.     FINAL CLINICAL IMPRESSION(S) / ED DIAGNOSES    Final diagnoses:  Effusion of left knee     Rx / DC Orders   ED Discharge Orders          Ordered    meloxicam (MOBIC) 15 MG tablet  Daily        05/30/23 0007    oxyCODONE-acetaminophen (PERCOCET) 5-325 MG tablet  Every 4 hours PRN        05/29/23 2202             Note:  This document was prepared using Dragon voice recognition software and may include unintentional dictation errors.    Cameron Ali, PA-C 05/30/23 0026    Merwyn Katos, MD 06/01/23 [phone]

## 2023-05-31 LAB — URIC ACID, BODY FLUID: Uric Acid Body Fluid: 7.2 mg/dL

## 2023-05-31 LAB — PROTEIN, BODY FLUID (OTHER): Total Protein, Body Fluid Other: 2.8 g/dL

## 2023-05-31 LAB — GLUCOSE, BODY FLUID OTHER: Glucose, Body Fluid Other: 122 mg/dL

## 2023-06-02 LAB — BODY FLUID CULTURE W GRAM STAIN: Culture: NO GROWTH

## 2023-06-20 ENCOUNTER — Telehealth: Payer: Self-pay

## 2023-06-20 NOTE — Telephone Encounter (Signed)
Transition Care Management Unsuccessful Follow-up Telephone Call  Date of discharge and from where:  [place] 11/11  Attempts:  1st Attempt  Reason for unsuccessful TCM follow-up call:  No answer/busy   Lenard Forth [place]  [facility], [facility] Guide, Phone: [phone] Website: [email or web address]

## 2023-06-21 ENCOUNTER — Telehealth: Payer: Self-pay

## 2023-06-21 NOTE — Telephone Encounter (Signed)
Transition Care Management Unsuccessful Follow-up Telephone Call  Date of discharge and from where:  [place] 11/11  Attempts:  2nd Attempt  Reason for unsuccessful TCM follow-up call:  No answer/busy   Lenard Forth [place]  [facility], [facility] Guide, Phone: [phone] Website: [email or web address]

## 2023-10-13 ENCOUNTER — Emergency Department

## 2023-10-13 ENCOUNTER — Other Ambulatory Visit: Payer: Self-pay

## 2023-10-13 ENCOUNTER — Emergency Department
Admission: EM | Admit: 2023-10-13 | Discharge: 2023-10-13 | Disposition: A | Discharge status: Home / Self Care | Attending: Emergency Medicine | Admitting: Emergency Medicine

## 2023-10-13 DIAGNOSIS — R519 Headache, unspecified: Secondary | ICD-10-CM | POA: Diagnosis not present

## 2023-10-13 DIAGNOSIS — R079 Chest pain, unspecified: Secondary | ICD-10-CM | POA: Diagnosis not present

## 2023-10-13 DIAGNOSIS — I10 Essential (primary) hypertension: Secondary | ICD-10-CM | POA: Insufficient documentation

## 2023-10-13 LAB — TROPONIN I (HIGH SENSITIVITY): Troponin I (High Sensitivity): 11 ng/L (ref <18)

## 2023-10-13 LAB — BASIC METABOLIC PANEL WITH GFR
Anion gap: 11 (ref 5–15)
BUN: 8 mg/dL (ref 6–20)
CO2: 21 mmol/L — ABNORMAL LOW (ref 22–32)
Calcium: 9 mg/dL (ref 8.9–10.3)
Chloride: 106 mmol/L (ref 98–111)
Creatinine, Ser: 0.84 mg/dL (ref 0.61–1.24)
GFR, Estimated: >60 mL/min (ref >60)
Glucose, Bld: 80 mg/dL (ref 70–99)
Potassium: 3.5 mmol/L (ref 3.5–5.1)
Sodium: 138 mmol/L (ref 135–145)

## 2023-10-13 LAB — CBC
HCT: 48.8 % (ref 39.0–52.0)
Hemoglobin: 16.1 g/dL (ref 13.0–17.0)
MCH: 29.4 pg (ref 26.0–34.0)
MCHC: 33 g/dL (ref 30.0–36.0)
MCV: 89.2 fL (ref 80.0–100.0)
Platelets: 310 10*3/uL (ref 150–400)
RBC: 5.47 MIL/uL (ref 4.22–5.81)
RDW: 13.2 % (ref 11.5–15.5)
WBC: 6.8 10*3/uL (ref 4.0–10.5)
nRBC: 0 % (ref 0.0–0.2)

## 2023-10-13 MED ORDER — BUTALBITAL-APAP-CAFFEINE 50-325-40 MG PO TABS
2.0000 | ORAL_TABLET | Freq: Once | ORAL | Status: AC
  Administered 2023-10-13: 2 via ORAL
  Filled 2023-10-13: qty 2

## 2023-10-13 MED ORDER — AMLODIPINE BESYLATE 5 MG PO TABS: 5.0000 mg | ORAL_TABLET | Freq: Every day | ORAL | 2 refills | Status: DC

## 2023-10-13 MED ORDER — AMLODIPINE BESYLATE 5 MG PO TABS
5.0000 mg | ORAL_TABLET | Freq: Once | ORAL | Status: AC
  Administered 2023-10-13: 5 mg via ORAL
  Filled 2023-10-13: qty 1

## 2023-10-13 MED ORDER — AMLODIPINE BESYLATE 10 MG PO TABS: 10.0000 mg | ORAL_TABLET | Freq: Every day | ORAL | 2 refills | Status: DC

## 2023-10-13 NOTE — ED Triage Notes (Signed)
 Pt arrived POV for Hypertensions x4 days, has not taken BP meds in one year d/t insurance issues. Pt reports headache and CP intermittently, denies SOB, n/v. A&O x4, NAD noted at current. Pt is a daily smoker. BP 174/102 ar current, otherwise VSS.   Reports BP at home was 221/126, 196/121

## 2023-10-13 NOTE — ED Provider Notes (Signed)
 [facility] Provider Note    Event Date/Time   First MD Initiated Contact with Patient 10/13/23 2031     (approximate)   History   Hypertension   HPI  Edwin Reyes is a 58 y.o. male who presents to the ED for evaluation of Hypertension   Patient presents to the ED for evaluation of chronic intermittent headaches and chest discomfort.  Reports going to a plasma donation center for the past 4 days and noting high blood pressures, as high as 200/100.  He presents due to these high numbers to ensure he is not having a stroke or heart attack.  Reports feeling okay right now without active symptoms.  6 cigarettes/day.  Previously on amlodipine, statin and HCTZ but has not been taking them for at least 1-2 years due to insurance and financial reasons.   Physical Exam   Triage Vital Signs: ED Triage Vitals  Encounter Vitals Group     BP 10/13/23 2006 (!) 174/102     Systolic BP Percentile --      Diastolic BP Percentile --      Pulse Rate 10/13/23 2011 75     Resp 10/13/23 2006 20     Temp 10/13/23 2011 98.1 F (36.7 C)     Temp Source 10/13/23 2011 Oral     SpO2 10/13/23 2011 96 %     Weight 10/13/23 2007 250 lb (113.4 kg)     Height 10/13/23 2007 5\' 11"  (1.803 m)     Head Circumference --      Peak Flow --      Pain Score 10/13/23 2007 6     Pain Loc --      Pain Education --      Exclude from Growth Chart --     Most recent vital signs: Vitals:   10/13/23 2130 10/13/23 2200  BP: (!) 161/94 (!) 159/91  Pulse: 63 68  Resp:    Temp:    SpO2: 100% 98%    General: Awake, no distress.  CV:  Good peripheral perfusion.  RRR without appreciable murmur Resp:  Normal effort.  Abd:  No distention.  MSK:  No deformity noted.  Neuro:  No focal deficits appreciated. Cranial nerves II through XII intact 5/5 strength and sensation in all 4 extremities Other:     ED Results / Procedures / Treatments   Labs (all labs ordered are listed, but  only abnormal results are displayed) Labs Reviewed  BASIC METABOLIC PANEL WITH GFR - Abnormal; Notable for the following components:      Result Value   CO2 21 (*)    All other components within normal limits  CBC  TROPONIN I (HIGH SENSITIVITY)  TROPONIN I (HIGH SENSITIVITY)    EKG Sinus rhythm with a rate of 74 bpm.  Normal axis and intervals.  Signs of LVH.  No clear signs of acute ischemia.  RADIOLOGY CXR interpreted by me without evidence of acute cardiopulmonary pathology. CT head interpreted by me without evidence of acute intracranial pathology  Official radiology report(s): CT HEAD WO CONTRAST ( ) Result Date: 10/13/2023 CLINICAL DATA:  Headache and hypertension. EXAM: CT HEAD WITHOUT CONTRAST TECHNIQUE: Contiguous axial images were obtained from the base of the skull through the vertex without intravenous contrast. RADIATION DOSE REDUCTION: This exam was performed according to the departmental dose-optimization program which includes automated exposure control, adjustment of the mA and/or kV according to patient size and/or use of iterative reconstruction technique. COMPARISON:  None Available. FINDINGS: Brain: No evidence of acute infarction, hemorrhage, hydrocephalus, extra-axial collection or mass lesion/mass effect. Vascular: No hyperdense vessel or unexpected calcification. Skull: Normal. Negative for fracture or focal lesion. Sinuses/Orbits: No acute finding. Other: None. IMPRESSION: No acute intracranial abnormality. Electronically Signed   By: Darliss Cheney M.D.   On: 10/13/2023 21:52   DG Chest 2 View Result Date: 10/13/2023 CLINICAL DATA:  Chest pain. Hypertension for 4 days. Headache and intermittent chest pain. Current smoker. EXAM: CHEST - 2 VIEW COMPARISON:  08/07/2022 FINDINGS: Normal heart size and pulmonary vascularity. No focal airspace disease or consolidation in the lungs. No blunting of costophrenic angles. No pneumothorax. Mediastinal contours appear intact.  Degenerative changes in the spine. IMPRESSION: No active cardiopulmonary disease. Electronically Signed   By: Burman Nieves M.D.   On: 10/13/2023 21:03    PROCEDURES and INTERVENTIONS:  .1-3 Lead EKG Interpretation  Performed by: Delton Prairie, MD Authorized by: Delton Prairie, MD     Interpretation: normal     ECG rate:  70   ECG rate assessment: normal     Rhythm: sinus rhythm     Ectopy: none     Conduction: normal     Medications  butalbital-acetaminophen-caffeine (FIORICET) 50-325-40 MG per tablet 2 tablet (2 tablets Oral Given 10/13/23 2101)  amLODipine (NORVASC) tablet 5 mg (5 mg Oral Given 10/13/23 2101)     IMPRESSION / MDM / ASSESSMENT AND PLAN / ED COURSE  I reviewed the triage vital signs and the nursing notes.  Differential diagnosis includes, but is not limited to, ACS, PTX, PNA, muscle strain/spasm, PE, dissection, anxiety, pleural effusion, ICH, anxiety  {Patient presents with symptoms of an acute illness or injury that is potentially life-threatening.  Patient presents with poorly controlled hypertension suitable for outpatient management without evidence of endorgan damage.  No current symptoms and he looks well without evidence of neurologic deficits.  Workup without evidence of endorgan damage.  Negative troponin, clear CT head and normal CBC.  Started back on amlodipine and referred to PCP.  Discussed ED return precautions.  Clinical Course as of 10/13/23 2218  Thu Oct 13, 2023  2217 Reassessed and discussed reassuring workup.  He is feeling well.  BP improving, 160/90.  We discussed management at home, starting amlodipine, PCP referral, expectant management and ED return precautions, answered questions [DS]    Clinical Course User Index [DS] Delton Prairie, MD     FINAL CLINICAL IMPRESSION(S) / ED DIAGNOSES   Final diagnoses:  Uncontrolled hypertension     Rx / DC Orders   ED Discharge Orders          Ordered    Ambulatory Referral to Primary  Care (Establish Care)       Comments: Uncontrolled hypertension   10/13/23 2058    amLODipine (NORVASC) 10 MG tablet  Daily,   Status:  Discontinued        10/13/23 2059    amLODipine (NORVASC) 5 MG tablet  Daily        10/13/23 2059             Note:  This document was prepared using Dragon voice recognition software and may include unintentional dictation errors.   Delton Prairie, MD 10/13/23 2219

## 2023-10-13 NOTE — Discharge Instructions (Addendum)
 Take the amlodipine medicine for your blood pressure once daily every day  You should get a phone call from a PCPs office to be seen in the clinic  If you develop any strokelike symptoms, chest pain, passing out or other concerns please return to the ED.  Please take Tylenol and ibuprofen/Advil for your pain.  It is safe to take them together, or to alternate them every few hours.  Take up to 1000mg  of Tylenol at a time, up to 4 times per day.  Do not take more than 4000 mg of Tylenol in 24 hours.  For ibuprofen, take 400-600 mg, 3 - 4 times per day.

## 2023-12-22 ENCOUNTER — Emergency Department
Admission: EM | Admit: 2023-12-22 | Discharge: 2023-12-22 | Disposition: A | Discharge status: Home / Self Care | Payer: Self-pay | Attending: Emergency Medicine | Admitting: Emergency Medicine

## 2023-12-22 ENCOUNTER — Other Ambulatory Visit: Payer: Self-pay

## 2023-12-22 DIAGNOSIS — M1712 Unilateral primary osteoarthritis, left knee: Secondary | ICD-10-CM | POA: Insufficient documentation

## 2023-12-22 DIAGNOSIS — M25462 Effusion, left knee: Secondary | ICD-10-CM

## 2023-12-22 LAB — BASIC METABOLIC PANEL WITH GFR
Anion gap: 9 (ref 5–15)
BUN: 12 mg/dL (ref 6–20)
CO2: 26 mmol/L (ref 22–32)
Calcium: 9.1 mg/dL (ref 8.9–10.3)
Chloride: 104 mmol/L (ref 98–111)
Creatinine, Ser: 1.07 mg/dL (ref 0.61–1.24)
GFR, Estimated: >60 mL/min (ref >60)
Glucose, Bld: 110 mg/dL — ABNORMAL HIGH (ref 70–99)
Potassium: 4.1 mmol/L (ref 3.5–5.1)
Sodium: 139 mmol/L (ref 135–145)

## 2023-12-22 LAB — CBC
HCT: 45.1 % (ref 39.0–52.0)
Hemoglobin: 15.5 g/dL (ref 13.0–17.0)
MCH: 29.7 pg (ref 26.0–34.0)
MCHC: 34.4 g/dL (ref 30.0–36.0)
MCV: 86.4 fL (ref 80.0–100.0)
Platelets: 323 10*3/uL (ref 150–400)
RBC: 5.22 MIL/uL (ref 4.22–5.81)
RDW: 13 % (ref 11.5–15.5)
WBC: 9.7 10*3/uL (ref 4.0–10.5)
nRBC: 0 % (ref 0.0–0.2)

## 2023-12-22 LAB — URIC ACID: Uric Acid, Serum: 8.1 mg/dL (ref 3.7–8.6)

## 2023-12-22 MED ORDER — OXYCODONE-ACETAMINOPHEN 5-325 MG PO TABS
1.0000 | ORAL_TABLET | Freq: Once | ORAL | Status: AC
  Administered 2023-12-22: 1 via ORAL
  Filled 2023-12-22: qty 1

## 2023-12-22 MED ORDER — MELOXICAM 15 MG PO TABS: 15.0000 mg | ORAL_TABLET | Freq: Every day | ORAL | 0 refills | Status: AC

## 2023-12-22 MED ORDER — OXYCODONE-ACETAMINOPHEN 5-325 MG PO TABS: 1.0000 | ORAL_TABLET | ORAL | 0 refills | Status: AC | PRN

## 2023-12-22 MED ORDER — KETOROLAC TROMETHAMINE 60 MG/2ML IM SOLN
60.0000 mg | Freq: Once | INTRAMUSCULAR | Status: AC
  Administered 2023-12-22: 60 mg via INTRAMUSCULAR
  Filled 2023-12-22: qty 2

## 2023-12-22 NOTE — ED Triage Notes (Signed)
 Pt here with left knee swelling x3 days. Pt denies fall or injuries. Pt states it started aching and now he cannot walk normally. Pt states it feels sharp and radiates down his leg.

## 2023-12-22 NOTE — ED Notes (Signed)
 Pt called out c/o pain 10 of 10.

## 2023-12-22 NOTE — ED Provider Notes (Signed)
 [facility] Provider Note   Event Date/Time   First MD Initiated Contact with Patient 12/22/23 1345     (approximate) History  Joint Swelling  HPI Edwin Reyes is a 58 y.o. male with stated past medical history of left knee osteoarthritis who presents complaining of recurrent swelling in this left knee with associated significant pain.  Patient states he has been unable to ambulate on this leg for the past 24 hours.  Patient denies any overlying bug bites, trauma, or feeling a pop or snap in this knee. ROS: Patient currently denies any vision changes, tinnitus, difficulty speaking, facial droop, sore throat, chest pain, shortness of breath, abdominal pain, nausea/vomiting/diarrhea, dysuria, or weakness/numbness/paresthesias in any extremity   Physical Exam  Triage Vital Signs: ED Triage Vitals [12/22/23 1256]  Encounter Vitals Group     BP (!) 165/99     Systolic BP Percentile      Diastolic BP Percentile      Pulse Rate 77     Resp (!) 21     Temp 98.3 F (36.8 C)     Temp Source Oral     SpO2 98 %     Weight 250 lb (113.4 kg)     Height 5\' 11"  (1.803 m)     Head Circumference      Peak Flow      Pain Score 10     Pain Loc      Pain Education      Exclude from Growth Chart    Most recent vital signs: Vitals:   12/22/23 1256  BP: (!) 165/99  Pulse: 77  Resp: (!) 21  Temp: 98.3 F (36.8 C)  SpO2: 98%   General: Awake, oriented x4. CV:  Good peripheral perfusion. Resp:  Normal effort. Abd:  No distention. Other:  Middle-aged obese African-American male resting comfortably in no acute distress.  Left knee with significant joint effusion and tenderness to palpation circumferentially ED Results / Procedures / Treatments  Labs (all labs ordered are listed, but only abnormal results are displayed) Labs Reviewed  BASIC METABOLIC PANEL WITH GFR - Abnormal; Notable for the following components:      Result Value   Glucose, Bld 110 (*)    All  other components within normal limits  CBC  URIC ACID   PROCEDURES: Critical Care performed: No Procedures MEDICATIONS ORDERED IN ED: Medications  ketorolac  (TORADOL ) injection 60 mg (60 mg Intramuscular Given 12/22/23 1445)  oxyCODONE -acetaminophen  (PERCOCET/ROXICET) 5-325 MG per tablet 1 tablet (1 tablet Oral Given 12/22/23 1448)   IMPRESSION / MDM / ASSESSMENT AND PLAN / ED COURSE  I reviewed the triage vital signs and the nursing notes.                             The patient is on the cardiac monitor to evaluate for evidence of arrhythmia and/or significant heart rate changes. Patient's presentation is most consistent with acute presentation with potential threat to life or bodily function. 58 year old male presents for left knee pain and recurrent effusion Given history, exam and workup I have low suspicion for fracture, dislocation, significant ligamentous injury, septic arthritis, gout flare, new autoimmune arthropathy, or gonococcal arthropathy.  Interventions: Patient has already been worked up for this knee effusion in the past and showed likely evidence of osteoarthritis. Rx: Percocet, Mobic  Disposition: Discharge home with strict return precautions and instructions for prompt primary care follow up in the  next week.   FINAL CLINICAL IMPRESSION(S) / ED DIAGNOSES   Final diagnoses:  Effusion of left knee  Primary osteoarthritis of left knee   Rx / DC Orders   ED Discharge Orders          Ordered    oxyCODONE -acetaminophen  (PERCOCET) 5-325 MG tablet  Every 4 hours PRN        12/22/23 1429    meloxicam  (MOBIC ) 15 MG tablet  Daily        12/22/23 1429           Note:  This document was prepared using Dragon voice recognition software and may include unintentional dictation errors.   Ladasia Sircy K, MD 12/22/23 [phone]

## 2024-05-14 ENCOUNTER — Emergency Department: Payer: Self-pay

## 2024-05-14 ENCOUNTER — Emergency Department
Admission: EM | Admit: 2024-05-14 | Discharge: 2024-05-15 | Disposition: A | Discharge status: Home / Self Care | Payer: Self-pay

## 2024-05-14 ENCOUNTER — Other Ambulatory Visit: Payer: Self-pay

## 2024-05-14 ENCOUNTER — Encounter: Payer: Self-pay | Admitting: Emergency Medicine

## 2024-05-14 DIAGNOSIS — R079 Chest pain, unspecified: Secondary | ICD-10-CM

## 2024-05-14 DIAGNOSIS — R519 Headache, unspecified: Secondary | ICD-10-CM

## 2024-05-14 DIAGNOSIS — I10 Essential (primary) hypertension: Secondary | ICD-10-CM | POA: Insufficient documentation

## 2024-05-14 LAB — COMPREHENSIVE METABOLIC PANEL WITH GFR
ALT: 35 U/L (ref 0–44)
AST: 34 U/L (ref 15–41)
Albumin: 4.5 g/dL (ref 3.5–5.0)
Alkaline Phosphatase: 89 U/L (ref 38–126)
Anion gap: 13 (ref 5–15)
BUN: 15 mg/dL (ref 6–20)
CO2: 26 mmol/L (ref 22–32)
Calcium: 9.7 mg/dL (ref 8.9–10.3)
Chloride: 100 mmol/L (ref 98–111)
Creatinine, Ser: 1.06 mg/dL (ref 0.61–1.24)
GFR, Estimated: >60 mL/min (ref >60)
Glucose, Bld: 98 mg/dL (ref 70–99)
Potassium: 3.7 mmol/L (ref 3.5–5.1)
Sodium: 139 mmol/L (ref 135–145)
Total Bilirubin: 1.3 mg/dL — ABNORMAL HIGH (ref 0.0–1.2)
Total Protein: 9 g/dL — ABNORMAL HIGH (ref 6.5–8.1)

## 2024-05-14 LAB — CBC
HCT: 47.9 % (ref 39.0–52.0)
Hemoglobin: 16.2 g/dL (ref 13.0–17.0)
MCH: 29.5 pg (ref 26.0–34.0)
MCHC: 33.8 g/dL (ref 30.0–36.0)
MCV: 87.1 fL (ref 80.0–100.0)
Platelets: 348 K/uL (ref 150–400)
RBC: 5.5 MIL/uL (ref 4.22–5.81)
RDW: 12.7 % (ref 11.5–15.5)
WBC: 7.4 K/uL (ref 4.0–10.5)
nRBC: 0 % (ref 0.0–0.2)

## 2024-05-14 LAB — BRAIN NATRIURETIC PEPTIDE: B Natriuretic Peptide: 16.4 pg/mL (ref 0.0–100.0)

## 2024-05-14 LAB — TROPONIN I (HIGH SENSITIVITY)
Troponin I (High Sensitivity): 7 ng/L (ref <18)
Troponin I (High Sensitivity): 8 ng/L (ref <18)

## 2024-05-14 MED ORDER — KETOROLAC TROMETHAMINE 15 MG/ML IJ SOLN
15.0000 mg | Freq: Once | INTRAMUSCULAR | Status: AC
  Administered 2024-05-14: 15 mg via INTRAVENOUS
  Filled 2024-05-14: qty 1

## 2024-05-14 MED ORDER — AMLODIPINE BESYLATE 5 MG PO TABS
5.0000 mg | ORAL_TABLET | Freq: Once | ORAL | Status: AC
  Administered 2024-05-14: 5 mg via ORAL
  Filled 2024-05-14: qty 1

## 2024-05-14 MED ORDER — ASPIRIN 81 MG PO CHEW
324.0000 mg | CHEWABLE_TABLET | Freq: Once | ORAL | Status: AC
  Administered 2024-05-14: 324 mg via ORAL
  Filled 2024-05-14: qty 4

## 2024-05-14 MED ORDER — ACETAMINOPHEN 325 MG PO TABS
650.0000 mg | ORAL_TABLET | Freq: Once | ORAL | Status: AC
  Administered 2024-05-14: 650 mg via ORAL
  Filled 2024-05-14: qty 2

## 2024-05-14 MED ORDER — AMLODIPINE BESYLATE 5 MG PO TABS: 5.0000 mg | ORAL_TABLET | Freq: Every day | ORAL | 3 refills | Status: DC

## 2024-05-14 MED ORDER — MAGNESIUM SULFATE 2 GM/50ML IV SOLN
2.0000 g | Freq: Once | INTRAVENOUS | Status: AC
  Administered 2024-05-14: 2 g via INTRAVENOUS
  Filled 2024-05-14: qty 50

## 2024-05-14 MED ORDER — DIAZEPAM 5 MG/ML IJ SOLN
2.5000 mg | Freq: Once | INTRAMUSCULAR | Status: AC
  Administered 2024-05-14: 2.5 mg via INTRAVENOUS
  Filled 2024-05-14: qty 2

## 2024-05-14 MED ORDER — DIPHENHYDRAMINE HCL 50 MG/ML IJ SOLN
12.5000 mg | Freq: Once | INTRAMUSCULAR | Status: AC
  Administered 2024-05-14: 12.5 mg via INTRAVENOUS
  Filled 2024-05-14: qty 1

## 2024-05-14 MED ORDER — METOCLOPRAMIDE HCL 5 MG/ML IJ SOLN
5.0000 mg | Freq: Once | INTRAMUSCULAR | Status: AC
  Administered 2024-05-14: 5 mg via INTRAVENOUS
  Filled 2024-05-14: qty 2

## 2024-05-14 NOTE — ED Provider Notes (Signed)
 [facility] Provider Note    Event Date/Time   First MD Initiated Contact with Patient 05/14/24 1955     (approximate)   History   Headache and Chest Pain   HPI  Edwin Reyes is a 58 y.o. male with a past medical history of hypertension, osteoarthritis who presents with 3 days of worsening headache and 1 year of intermittent chest pain.  Patient states that he has been out of his blood pressure medication for the past 9 months.  He does not have a primary care physician.  He denies any head trauma.  He does have a history of headaches but this one is persistent.  Denies any hearing or vision changes.  Denies any chest pain currently.  No shortness of breath abdominal pain or changes in urinary or bowel habits.  He denies any SI HI or AVH S      Physical Exam   Triage Vital Signs: ED Triage Vitals  Encounter Vitals Group     BP 05/14/24 1930 (!) 184/124     Girls Systolic BP Percentile --      Girls Diastolic BP Percentile --      Boys Systolic BP Percentile --      Boys Diastolic BP Percentile --      Pulse Rate 05/14/24 1930 73     Resp --      Temp 05/14/24 1930 98.2 F (36.8 C)     Temp src --      SpO2 05/14/24 1930 97 %     Weight 05/14/24 1919 250 lb (113.4 kg)     Height 05/14/24 1919 5' 10 (1.778 m)     Head Circumference --      Peak Flow --      Pain Score 05/14/24 1918 8     Pain Loc --      Pain Education --      Exclude from Growth Chart --     Most recent vital signs: Vitals:   05/14/24 2045 05/14/24 2230  BP: (!) 161/86 (!) 148/86  Pulse: 67 67  Resp: 18 17  Temp:    SpO2: 100% 99%    Nursing Triage Note reviewed. Vital signs reviewed and patients oxygen saturation is normoxic  General: Patient is well nourished, well developed, awake and alert, resting comfortably in no acute distress Head: Normocephalic and atraumatic Eyes: Normal inspection, extraocular muscles intact, no conjunctival pallor Ear, nose, throat:  Normal external exam Neck: Normal range of motion Respiratory: Patient is in no respiratory distress, lungs CTAB Cardiovascular: Patient is not tachycardic, RRR without murmur appreciated GI: Abd SNT with no guarding or rebound  Back: Normal inspection of the back with good strength and range of motion throughout all ext Extremities: pulses intact with good cap refills, no LE pitting edema or calf tenderness Neuro: The patient is alert and oriented to person, place, and time, appropriately conversive, with 5/5 bilat UE/LE strength, no gross motor or sensory defects noted. Coordination appears to be adequate.  Ambulates without ataxia Skin: Warm, dry, and intact Psych: normal mood and affect, no SI or HI  ED Results / Procedures / Treatments   Labs (all labs ordered are listed, but only abnormal results are displayed) Labs Reviewed  COMPREHENSIVE METABOLIC PANEL WITH GFR - Abnormal; Notable for the following components:      Result Value   Total Protein 9.0 (*)    Total Bilirubin 1.3 (*)    All other components within  normal limits  CBC  BRAIN NATRIURETIC PEPTIDE  TROPONIN I (HIGH SENSITIVITY)  TROPONIN I (HIGH SENSITIVITY)     EKG EKG and rhythm strip are interpreted by myself:   EKG: [Normal sinus rhythm] at heart rate of 71, normal QRS duration, QTc 439, nonspecific ST segments and T waves no ectopy EKG not consistent with Acute STEMI Rhythm strip: NSR in lead II   RADIOLOGY CT head wo contrast: No intracranial hemorrhage on my independent review interpretation radiologist agrees, radiologist reports empty sella-this Possibly secondary to idiopathic intracranial hypertension however patient's presentation does not seem consistent with this and suspect is more related to congenital abnormality Chest x-ray: No acute abnormality on my independent review interpretation    PROCEDURES:  Critical Care performed: No  Procedures   MEDICATIONS ORDERED IN ED: Medications   amLODipine  (NORVASC ) tablet 5 mg (5 mg Oral Given 05/14/24 2055)  aspirin  chewable tablet 324 mg (324 mg Oral Given 05/14/24 2052)  magnesium sulfate IVPB 2 g 50 mL (0 g Intravenous Stopped 05/14/24 2233)  diphenhydrAMINE (BENADRYL) injection 12.5 mg (12.5 mg Intravenous Given 05/14/24 2054)  metoCLOPramide (REGLAN) injection 5 mg (5 mg Intravenous Given 05/14/24 2058)  acetaminophen  (TYLENOL ) tablet 650 mg (650 mg Oral Given 05/14/24 2052)  ketorolac  (TORADOL ) 15 MG/ML injection 15 mg (15 mg Intravenous Given 05/14/24 2233)  diazepam (VALIUM) injection 2.5 mg (2.5 mg Intravenous Given 05/14/24 2233)     IMPRESSION / MDM / ASSESSMENT AND PLAN / ED COURSE                                Differential diagnosis includes, but is not limited to, hypertensive urgency, atypical ACS, intracranial hemorrhage, electrolyte derangement anemia, migraine  ED course: Patient is well-appearing and without any focal neurological deficits.  His blood pressure was elevated on presentation and he was given a dose of amlodipine .  CT head demonstrated no intracranial hemorrhage.  Headache resolved with downtrending of blood pressure and also Tylenol , Benadryl, Reglan, magnesium ketorolac  and a dose of Valium.  Chest x-ray was unremarkable and his troponins were not elevated x 2.  He had no profound electrolyte derangements or anemia.  I have given him a prescription for 90 days with refills x 30 of Norvasc .  I have placed an outpatient referral for primary care physician.  All questions answered and patient voiced understanding and she will get a ride home today   Clinical Course as of 05/14/24 2346  Mon May 14, 2024  2033 Troponin I (High Sensitivity): 7 Not elevated [HD]  2047 Troponin I (High Sensitivity): 7 [HD]  2048 DG Chest 2 View No acute abnormality [HD]  2142 CT Head Wo Contrast No intracranial hemorrhage, partially empty sella however could be an anatomical variant [HD]  2144 Patient reports that  his headache has gone from a 10 out of 10 to a 3 out of 10.  Will give him some Toradol  and a small dose of valiu but suspect he will be ready to go home shortly.  He states that he has someone who can drive him home today [HD]  2201 Troponin I (High Sensitivity): 8 Not elevated [HD]    Clinical Course User Index [HD] Nicholaus Rolland BRAVO, MD   At time of discharge there is no evidence of acute life, limb, vision, or fertility threat. Patient has stable vital signs, pain is well controlled, patient is ambulatory and p.o. tolerant.  Discharge instructions were  completed using the EPIC system. I would refer you to those at this time. All warnings prescriptions follow-up etc. were discussed in detail with the patient. Patient indicates understanding and is agreeable with this plan. All questions answered.  Patient is made aware that they may return to the emergency department for any worsening or new condition or for any other emergency.   -- Risk: 5 This patient has a high risk of morbidity due to further diagnostic testing or treatment. Rationale: This patient's evaluation and management involve a high risk of morbidity due to the potential severity of presenting symptoms, need for diagnostic testing, and/or initiation of treatment that may require close monitoring. The differential includes conditions with potential for significant deterioration or requiring escalation of care. Treatment decisions in the ED, including medication administration, procedural interventions, or disposition planning, reflect this level of risk. COPA: 5 The patient has the following acute or chronic illness/injury that poses a possible threat to life or bodily function: [X] : The patient has a potentially serious acute condition or an acute exacerbation of a chronic illness requiring urgent evaluation and management in the Emergency Department. The clinical presentation necessitates immediate consideration of life-threatening  or function-threatening diagnoses, even if they are ultimately ruled out.   FINAL CLINICAL IMPRESSION(S) / ED DIAGNOSES   Final diagnoses:  Nonintractable headache, unspecified chronicity pattern, unspecified headache type  Uncontrolled hypertension  Chest pain, unspecified type     Rx / DC Orders   ED Discharge Orders          Ordered    amLODipine  (NORVASC ) 5 MG tablet  Daily        05/14/24 2203    Ambulatory Referral to Primary Care (Establish Care)        05/14/24 2203    Ambulatory referral to Cardiology       Comments: If you have not heard from the Cardiology office within the next 72 hours please call [phone].   05/14/24 2203             Note:  This document was prepared using Dragon voice recognition software and may include unintentional dictation errors.   Nicholaus Rolland BRAVO, MD 05/14/24 [phone]

## 2024-05-14 NOTE — ED Triage Notes (Signed)
 Patient ambulatory to triage with steady gait, without difficulty or distress noted; pt reports frontal HA for last 2-3 days; pt reports has had no BP meds for last 8-9 mos; pt also c/o intermittent mid CP last yr; no meds taken PTA

## 2024-05-14 NOTE — Discharge Instructions (Signed)
 Do not drive or operate any machinery for the next 24 hours.  Rest and relax.  Your next dose of blood pressure medication will be due tomorrow morning.  I have placed referrals to primary care physician and cardiology for you.  Please return with any acutely worsening symptoms or any other emergency. -- RETURN PRECAUTIONS & AFTERCARE: (ENGLISH) RETURN PRECAUTIONS: Return immediately to the emergency department or see/call your doctor if you feel worse, weak or have changes in speech or vision, are short of breath, have fever, vomiting, pain, bleeding or dark stool, trouble urinating or any new issues. Return here or see/call your doctor if not improving as expected for your suspected condition. FOLLOW-UP CARE: Call your doctor and/or any doctors we referred you to for more advice and to make an appointment. Do this today, tomorrow or after the weekend. Some doctors only take PPO insurance so if you have HMO insurance you may want to contact your HMO or your regular doctor for referral to a specialist within your plan. Either way tell the doctor's office that it was a referral from the emergency department so you get the soonest possible appointment.  YOUR TEST RESULTS: Take result reports of any blood or urine tests, imaging tests and EKG's to your doctor and any referral doctor. Have any abnormal tests repeated. Your doctor or a referral doctor can let you know when this should be done. Also make sure your doctor contacts this hospital to get any test results that are not currently available such as cultures or special tests for infection and final imaging reports, which are often not available at the time you leave the ER but which may list additional important findings that are not documented on the preliminary report. BLOOD PRESSURE: If your blood pressure was greater than 120/80 have your blood pressure rechecked within 1 to 2 weeks. MEDICATION SIDE EFFECTS: Do not drive, walk, bike, take the bus, etc.  if you have received or are being prescribed any sedating medications such as those for pain or anxiety or certain antihistamines like Benadryl. If you have been give one of these here get a taxi home or have a friend drive you home. Ask your pharmacist to counsel you on potential side effects of any new medication

## 2024-05-21 NOTE — Progress Notes (Deleted)
  Cardiology Office Note   Date:  05/21/2024  ID:  Maxamilian Amadon, DOB Jun 27, 1966, MRN [id number] PCP: Patient, No Pcp Per  [facility] Providers Cardiologist:  None { Click to update primary MD,subspecialty MD or APP then REFRESH:1}    History of Present Illness Erastus Bartolomei is a 58 y.o. male PMH HTN who presents for further evaluation management of chest pain.  Patient was seen in the ED on 05/14/2024 for headache.  At some point during his evaluation, he reported some chest discomfort.  Serial troponin was normal.  BNP was normal. No LDL on file.  Relevant CVD History -None   ROS: Pt denies any chest discomfort, jaw pain, arm pain, palpitations, syncope, presyncope, orthopnea, PND, or LE edema.  Studies Reviewed I have independently reviewed the patient's ECG, ***.  Physical Exam VS:  There were no vitals taken for this visit.       Wt Readings from Last 3 Encounters:  05/14/24 250 lb (113.4 kg)  12/22/23 250 lb (113.4 kg)  10/13/23 250 lb (113.4 kg)    GEN: No acute distress. NECK: No JVD; No carotid bruits. CARDIAC: ***RRR, no murmurs, rubs, gallops. RESPIRATORY:  Clear to auscultation. EXTREMITIES:  Warm and well-perfused. No edema.  ASSESSMENT AND PLAN Chest discomfort HTN        {Are you ordering a CV Procedure (e.g. stress test, cath, DCCV, TEE, etc)?   Press F2        :[id number]}  Dispo: ***  Signed, Caron Poser, MD

## 2024-05-22 ENCOUNTER — Ambulatory Visit: Payer: Self-pay

## 2024-05-24 ENCOUNTER — Ambulatory Visit: Payer: Self-pay

## 2024-05-24 VITALS — BP 126/76 | HR 70 | Ht 70.0 in | Wt 255.6 lb

## 2024-05-24 DIAGNOSIS — I10 Essential (primary) hypertension: Secondary | ICD-10-CM

## 2024-05-24 DIAGNOSIS — Z79899 Other long term (current) drug therapy: Secondary | ICD-10-CM

## 2024-05-24 DIAGNOSIS — R9431 Abnormal electrocardiogram [ECG] [EKG]: Secondary | ICD-10-CM

## 2024-05-24 DIAGNOSIS — R079 Chest pain, unspecified: Secondary | ICD-10-CM

## 2024-05-24 DIAGNOSIS — R072 Precordial pain: Secondary | ICD-10-CM

## 2024-05-24 MED ORDER — AMLODIPINE BESYLATE 10 MG PO TABS: 10.0000 mg | ORAL_TABLET | Freq: Every day | ORAL | 3 refills | Status: AC

## 2024-05-24 MED ORDER — METOPROLOL TARTRATE 50 MG PO TABS: ORAL_TABLET | ORAL | 0 refills | Status: DC

## 2024-05-24 MED ORDER — METOPROLOL TARTRATE 100 MG PO TABS: 100.0000 mg | ORAL_TABLET | Freq: Once | ORAL | 0 refills | Status: AC

## 2024-05-24 NOTE — Patient Instructions (Signed)
 Medication Instructions:  Your physician recommends the following medication changes.  START TAKING: Amlodipine  (NORVASC ) 10 mg by  mouth once daily  Take all other medications as prescribed   *If you need a refill on your cardiac medications before your next appointment, please call your pharmacy*  Lab Work: No labs ordered today  If you have labs (blood work) drawn today and your tests are completely normal, you will receive your results only by: MyChart Message (if you have MyChart) OR A paper copy in the mail If you have any lab test that is abnormal or we need to change your treatment, we will call you to review the results.  Testing/Procedures: Your physician has requested that you have an echocardiogram. Echocardiography is a painless test that uses sound waves to create images of your heart. It provides your doctor with information about the size and shape of your heart and how well your heart's chambers and valves are working.   You may receive an ultrasound enhancing agent through an IV if needed to better visualize your heart during the echo. This procedure takes approximately one hour.  There are no restrictions for this procedure.  This will take place at [address] [facility] [address] [facility]) [address], [place] [address]  Please note: We ask at that you not bring children with you during ultrasound (echo/ vascular) testing. Due to room size and safety concerns, children are not allowed in the ultrasound rooms during exams. Our front office staff cannot provide observation of children in our lobby area while testing is being conducted. An adult accompanying a patient to their appointment will only be allowed in the ultrasound room at the discretion of the ultrasound technician under special circumstances. We apologize for any inconvenience.     Your cardiac CT will be scheduled at one of the below locations:   [facility] [address] [place], [place] [address] [phone]  Please arrive 15 mins early for check-in and test prep.  There is spacious parking Copy Available) and easy access to the radiology department from the [facility] entrance. Please enter here and check-in with the desk attendant.   Please follow these instructions carefully (unless otherwise directed):  An IV will be required for this test and Nitroglycerin will be given.  Hold all erectile dysfunction medications at least 3 days (72 hrs) prior to test. (Ie viagra, cialis, sildenafil, tadalafil, etc)   On the Night Before the Test: Be sure to Drink plenty of water. Do not consume any caffeinated/decaffeinated beverages or chocolate 12 hours prior to your test. Do not take any antihistamines 12 hours prior to your test.  On the Day of the Test: Drink plenty of water until 1 hour prior to the test. Do not eat any food 1 hour prior to test. You may take your regular medications prior to the test.  Take metoprolol (Lopressor) 100 MG two hours prior to test.     After the Test: Drink plenty of water. After receiving IV contrast, you may experience a mild flushed feeling. This is normal. On occasion, you may experience a mild rash up to 24 hours after the test. This is not dangerous. If this occurs, you can take Benadryl 25 mg, Zyrtec, Claritin, or Allegra and increase your fluid intake. (Patients taking Tikosyn should avoid Benadryl, and may take Zyrtec, Claritin, or Allegra) If you experience trouble breathing, this can be serious. If it is severe call 911 IMMEDIATELY. If it  is mild, please call our office.  We will call to schedule your test 2-4 weeks out understanding that some insurance companies will need an authorization prior to the service being performed.   For more information and frequently asked questions, please visit our website : [email or web address]  For non-scheduling related questions, please contact  the cardiac imaging nurse navigator should you have any questions/concerns: Cardiac Imaging Nurse Navigators Direct Office Dial: [phone]   For scheduling needs, including cancellations and rescheduling, please call Brittany, [phone].    Follow-Up: At [facility], you and your health needs are our priority.  As part of our continuing mission to provide you with exceptional heart care, our providers are all part of one team.  This team includes your primary Cardiologist (physician) and Advanced Practice Providers or APPs (Physician Assistants and Nurse Practitioners) who all work together to provide you with the care you need, when you need it.  Your next appointment:   As Needed   Provider:  Caron Poser, MD   We recommend signing up for the patient portal called MyChart.  Sign up information is provided on this After Visit Summary.  MyChart is used to connect with patients for Virtual Visits (Telemedicine).  Patients are able to view lab/test results, encounter notes, upcoming appointments, etc.  Non-urgent messages can be sent to your provider as well.   To learn more about what you can do with MyChart, go to [email or web address].

## 2024-05-24 NOTE — Progress Notes (Signed)
  Cardiology Office Note   Date:  05/24/2024  ID:  Edwin Reyes, DOB July 28, 1965, MRN [id number] PCP: Patient, No Pcp Per  [facility] Providers Cardiologist:  Caron Poser, MD     History of Present Illness Edwin Reyes is a 58 y.o. male PMH HTN who presents for further evaluation management of chest pain.  Patient was seen in the ED on 05/14/2024 for headache.  At some point during his evaluation, he reported some chest discomfort.  Serial troponin was normal.  BNP was normal. No LDL on file.  Patient reports to me a several year history of intermittent chest discomfort.  The chest pain is often sharp in character.  Sometimes happens at rest, sometimes with exertion.  The discomfort is sometimes long-lasting, other times brief.  No clear discernible pattern.  Relevant CVD History -None   ROS: Pt denies any chest discomfort, jaw pain, arm pain, palpitations, syncope, presyncope, orthopnea, PND, or LE edema.  Studies Reviewed I have independently reviewed the patient's ECG, recent medical records, previous blood work.  Physical Exam VS:  BP 126/76 (BP Location: Left Arm, Patient Position: Sitting, Cuff Size: Normal)   Pulse 70   Ht 5' 10 (1.778 m)   Wt 255 lb 9.6 oz (115.9 kg)   SpO2 97%   BMI 36.67 kg/m        Wt Readings from Last 3 Encounters:  05/24/24 255 lb 9.6 oz (115.9 kg)  05/14/24 250 lb (113.4 kg)  12/22/23 250 lb (113.4 kg)    GEN: No acute distress. NECK: No JVD; No carotid bruits. CARDIAC: RRR, no murmurs, rubs, gallops. RESPIRATORY:  Clear to auscultation. EXTREMITIES:  Warm and well-perfused. No edema.  ASSESSMENT AND PLAN Chest discomfort Abnormal ECG Patient presents with undifferentiated chest discomfort which is possibly cardiac.  He does have risk factors and his ECG shows nonspecific T wave abnormalities in the lateral and anterolateral leads.  Further testing is indicated.  Plan: - Coronary CT angiogram to evaluate for ischemic  causes - Echocardiogram to evaluate for structural heart disease  HTN Well-controlled today after patient reports taking 2 of his 5 mg amlodipine  doses.  He reports that when he takes 2 doses he also no longer gets headaches when he wakes up in the morning.  Will plan to send a new prescription for 10 mg daily.        Dispo: RTC as needed based on results of cardiac testing  Signed, Caron Poser, MD

## 2024-05-30 ENCOUNTER — Other Ambulatory Visit: Payer: Self-pay

## 2024-05-30 ENCOUNTER — Emergency Department
Admission: EM | Admit: 2024-05-30 | Discharge: 2024-05-30 | Disposition: A | Discharge status: Home / Self Care | Payer: Self-pay | Attending: Emergency Medicine | Admitting: Emergency Medicine

## 2024-05-30 ENCOUNTER — Emergency Department: Payer: Self-pay

## 2024-05-30 DIAGNOSIS — S6391XA Sprain of unspecified part of right wrist and hand, initial encounter: Secondary | ICD-10-CM | POA: Insufficient documentation

## 2024-05-30 DIAGNOSIS — I10 Essential (primary) hypertension: Secondary | ICD-10-CM | POA: Insufficient documentation

## 2024-05-30 DIAGNOSIS — X58XXXA Exposure to other specified factors, initial encounter: Secondary | ICD-10-CM | POA: Insufficient documentation

## 2024-05-30 LAB — CBC
HCT: 44.8 % (ref 39.0–52.0)
Hemoglobin: 15.4 g/dL (ref 13.0–17.0)
MCH: 29.7 pg (ref 26.0–34.0)
MCHC: 34.4 g/dL (ref 30.0–36.0)
MCV: 86.5 fL (ref 80.0–100.0)
Platelets: 362 K/uL (ref 150–400)
RBC: 5.18 MIL/uL (ref 4.22–5.81)
RDW: 12.7 % (ref 11.5–15.5)
WBC: 8.9 K/uL (ref 4.0–10.5)
nRBC: 0 % (ref 0.0–0.2)

## 2024-05-30 LAB — URINALYSIS, ROUTINE W REFLEX MICROSCOPIC
Bilirubin Urine: NEGATIVE
Glucose, UA: NEGATIVE mg/dL
Hgb urine dipstick: NEGATIVE
Ketones, ur: NEGATIVE mg/dL
Leukocytes,Ua: NEGATIVE
Nitrite: NEGATIVE
Protein, ur: NEGATIVE mg/dL
Specific Gravity, Urine: 1.027 (ref 1.005–1.030)
pH: 5 (ref 5.0–8.0)

## 2024-05-30 LAB — COMPREHENSIVE METABOLIC PANEL WITH GFR
ALT: 46 U/L — ABNORMAL HIGH (ref 0–44)
AST: 46 U/L — ABNORMAL HIGH (ref 15–41)
Albumin: 4.3 g/dL (ref 3.5–5.0)
Alkaline Phosphatase: 112 U/L (ref 38–126)
Anion gap: 10 (ref 5–15)
BUN: 10 mg/dL (ref 6–20)
CO2: 26 mmol/L (ref 22–32)
Calcium: 9.3 mg/dL (ref 8.9–10.3)
Chloride: 104 mmol/L (ref 98–111)
Creatinine, Ser: 0.94 mg/dL (ref 0.61–1.24)
GFR, Estimated: >60 mL/min (ref >60)
Glucose, Bld: 125 mg/dL — ABNORMAL HIGH (ref 70–99)
Potassium: 4.1 mmol/L (ref 3.5–5.1)
Sodium: 140 mmol/L (ref 135–145)
Total Bilirubin: 0.8 mg/dL (ref 0.0–1.2)
Total Protein: 7.2 g/dL (ref 6.5–8.1)

## 2024-05-30 NOTE — ED Provider Notes (Signed)
 [facility] Provider Note    Event Date/Time   First MD Initiated Contact with Patient 05/30/24 1424     (approximate)   History   Hand Injury   HPI  Imran Nuon is a 58 y.o. male with PMH of hypertension, hyperlipidemia and gout presents for evaluation of right hand pain.  Patient states that 2 nights ago his children heard a loud noise sometime in the morning.  Patient does not remember falling specifically or hitting his hand.  He did wake up the next day with swelling and pain to the right hand.  Patient presents to the ER today worsening hand pain.  Denies fevers.  No use of sleep medication, alcohol or other drugs.  Has been taking ibuprofen for the pain.      Physical Exam   Triage Vital Signs: ED Triage Vitals  Encounter Vitals Group     BP 05/30/24 1332 (!) 153/92     Girls Systolic BP Percentile --      Girls Diastolic BP Percentile --      Boys Systolic BP Percentile --      Boys Diastolic BP Percentile --      Pulse Rate 05/30/24 1332 81     Resp 05/30/24 1332 18     Temp 05/30/24 1332 98.7 F (37.1 C)     Temp Source 05/30/24 1332 Oral     SpO2 05/30/24 1332 97 %     Weight --      Height --      Head Circumference --      Peak Flow --      Pain Score 05/30/24 1331 10     Pain Loc --      Pain Education --      Exclude from Growth Chart --     Most recent vital signs: Vitals:   05/30/24 1332  BP: (!) 153/92  Pulse: 81  Resp: 18  Temp: 98.7 F (37.1 C)  SpO2: 97%   General: Awake, no distress.  CV:  Good peripheral perfusion. Resp:  Normal effort.  Abd:  No distention.  Other:  Right dorsum of the hand is swollen when compared with the left side, no overlying skin changes or ecchymosis, tender to palpation throughout the hand, with most tenderness over the radiocarpal joint, patient able to perform wrist range of motion and can flex and extend all the fingers although this does elicit pain, radial pulse 2+ and  regular, sensation maintained throughout the fingers   ED Results / Procedures / Treatments   Labs (all labs ordered are listed, but only abnormal results are displayed) Labs Reviewed  COMPREHENSIVE METABOLIC PANEL WITH GFR - Abnormal; Notable for the following components:      Result Value   Glucose, Bld 125 (*)    AST 46 (*)    ALT 46 (*)    All other components within normal limits  URINALYSIS, ROUTINE W REFLEX MICROSCOPIC - Abnormal; Notable for the following components:   Color, Urine YELLOW (*)    APPearance CLEAR (*)    All other components within normal limits  CBC  CBG MONITORING, ED     EKG  ED provider interpretation: Sinus rhythm with first-degree AV block  Vent. rate 68 BPM PR interval 224 ms QRS duration 110 ms QT/QTcB 404/429 ms P-R-T axes 49 -47 0  RADIOLOGY  Right hand x-ray is negative for fractures but does show soft tissue swelling.  PROCEDURES:  Critical Care  performed: No  Procedures   MEDICATIONS ORDERED IN ED: Medications - No data to display   IMPRESSION / MDM / ASSESSMENT AND PLAN / ED COURSE  I reviewed the triage vital signs and the nursing notes.                             58 year old male presents for evaluation of right hand pain.  Blood pressure is elevated otherwise vital signs are stable.  Patient uncomfortable on exam.  Differential diagnosis includes, but is not limited to, fracture, sprain, strain, gout flare, arthritis.  Patient's presentation is most consistent with acute complicated illness / injury requiring diagnostic workup.  Right hand x-rays negative for fractures but does show generalized soft tissue swelling.  CBC and CMP are unremarkable.  UA is negative for signs of infection.  EKG shows normal sinus rhythm with first-degree AV block.  Given that it sounds like this hand injury occurred during a fall patient had workup for syncope.  Workup is reassuring.  Review of patient's chart shows that he has been  seen by cardiology on 11/6.  They recommended further evaluation with Coronary CT angiogram and echo.  Will advise patient to follow-up with cardiology as he may need a Holter monitor.  Hand is swollen on exam and is tender to palpation. Given negative xrays suspect sprain. Will plan to place patient in wrist brace and sling. Advised tylenol  and ibuprofen as needed for pain relief. Also discussed ice, heat and topical pain relievers.   Reviewed strict return precautions, patient voiced understanding, all questions were answered and he was stable at discharge.     FINAL CLINICAL IMPRESSION(S) / ED DIAGNOSES   Final diagnoses:  Sprain of right hand, initial encounter     Rx / DC Orders   ED Discharge Orders     None        Note:  This document was prepared using Dragon voice recognition software and may include unintentional dictation errors.   Cleaster Tinnie LABOR, PA-C 05/30/24 1609    Dorothyann Drivers, MD 05/30/24 1930

## 2024-05-30 NOTE — Discharge Instructions (Addendum)
 Please wear the brace and sling as needed for comfort.  Stop wearing them when your pain resolves.  I have attached information for orthopedics so you can schedule follow-up appointment with if your pain persist past a week.  Please make sure you follow-up with your cardiologist.  You can take 650 mg of Tylenol  and 600 mg of ibuprofen every 6 hours as needed for pain. You can use ice, heat, muscle creams and other topical pain relievers as well.  These return to the emergency department with any worsening symptoms like increasing pain, swelling, redness, warmth or development of a fever.

## 2024-05-30 NOTE — ED Triage Notes (Signed)
 Patient states two night ago his children heard a loud noise around 0400. Patient does not remember falling or hitting his hand. Woke up the next day with swelling and pain to right hand. Unsure if he had a syncopal episode; denies taking sleep aids and reports no drug/alcohol use.

## 2024-07-16 ENCOUNTER — Ambulatory Visit: Payer: Self-pay

## 2024-07-25 ENCOUNTER — Encounter (HOSPITAL_COMMUNITY): Payer: Self-pay

## 2024-07-26 ENCOUNTER — Encounter (HOSPITAL_COMMUNITY): Payer: Self-pay
